# Patient Record
Sex: Female | Born: 1970 | Race: White | Hispanic: No | Marital: Married | Smoking: Never smoker
Health system: Southern US, Community
[De-identification: ages and names within clinical notes are randomized; demographics above are authoritative.]

## PROBLEM LIST (undated history)

## (undated) DIAGNOSIS — N6320 Unspecified lump in the left breast, unspecified quadrant: Secondary | ICD-10-CM

## (undated) DIAGNOSIS — R51 Headache: Secondary | ICD-10-CM

## (undated) DIAGNOSIS — R519 Headache, unspecified: Secondary | ICD-10-CM

## (undated) DIAGNOSIS — F32A Depression, unspecified: Secondary | ICD-10-CM

## (undated) DIAGNOSIS — F329 Major depressive disorder, single episode, unspecified: Secondary | ICD-10-CM

## (undated) HISTORY — PX: BREAST SURGERY: SHX581

---

## 2002-11-24 ENCOUNTER — Encounter: Payer: Self-pay | Admitting: Family Medicine

## 2002-11-24 ENCOUNTER — Encounter: Admission: RE | Admit: 2002-11-24 | Discharge: 2002-11-24 | Payer: Self-pay | Admitting: Family Medicine

## 2004-06-30 ENCOUNTER — Other Ambulatory Visit: Admission: RE | Admit: 2004-06-30 | Discharge: 2004-06-30 | Payer: Self-pay | Admitting: Obstetrics and Gynecology

## 2004-11-12 ENCOUNTER — Emergency Department (HOSPITAL_COMMUNITY): Admission: EM | Admit: 2004-11-12 | Discharge: 2004-11-12 | Payer: Self-pay | Admitting: Emergency Medicine

## 2004-11-12 ENCOUNTER — Encounter (INDEPENDENT_AMBULATORY_CARE_PROVIDER_SITE_OTHER): Payer: Self-pay | Admitting: Specialist

## 2005-01-23 ENCOUNTER — Other Ambulatory Visit: Admission: RE | Admit: 2005-01-23 | Discharge: 2005-01-23 | Payer: Self-pay | Admitting: Obstetrics and Gynecology

## 2005-08-26 ENCOUNTER — Encounter (INDEPENDENT_AMBULATORY_CARE_PROVIDER_SITE_OTHER): Payer: Self-pay | Admitting: Specialist

## 2005-08-26 ENCOUNTER — Inpatient Hospital Stay (HOSPITAL_COMMUNITY): Admission: AD | Admit: 2005-08-26 | Discharge: 2005-08-29 | Payer: Self-pay | Admitting: Obstetrics and Gynecology

## 2005-08-30 ENCOUNTER — Encounter: Admission: RE | Admit: 2005-08-30 | Discharge: 2005-09-29 | Payer: Self-pay | Admitting: Obstetrics and Gynecology

## 2005-09-28 ENCOUNTER — Other Ambulatory Visit: Admission: RE | Admit: 2005-09-28 | Discharge: 2005-09-28 | Payer: Self-pay | Admitting: Obstetrics and Gynecology

## 2007-04-06 ENCOUNTER — Emergency Department (HOSPITAL_COMMUNITY): Admission: EM | Admit: 2007-04-06 | Discharge: 2007-04-06 | Payer: Self-pay | Admitting: Family Medicine

## 2007-04-24 ENCOUNTER — Inpatient Hospital Stay (HOSPITAL_COMMUNITY): Admission: RE | Admit: 2007-04-24 | Discharge: 2007-04-26 | Payer: Self-pay | Admitting: Obstetrics and Gynecology

## 2008-10-01 ENCOUNTER — Other Ambulatory Visit: Admission: RE | Admit: 2008-10-01 | Discharge: 2008-10-01 | Payer: Self-pay | Admitting: Obstetrics and Gynecology

## 2008-10-08 ENCOUNTER — Encounter: Admission: RE | Admit: 2008-10-08 | Discharge: 2008-10-08 | Payer: Self-pay | Admitting: Obstetrics and Gynecology

## 2010-01-11 ENCOUNTER — Encounter: Admission: RE | Admit: 2010-01-11 | Discharge: 2010-01-11 | Payer: Self-pay | Admitting: Family Medicine

## 2010-03-10 ENCOUNTER — Ambulatory Visit (HOSPITAL_BASED_OUTPATIENT_CLINIC_OR_DEPARTMENT_OTHER): Admission: RE | Admit: 2010-03-10 | Discharge: 2010-03-10 | Payer: Self-pay | Admitting: Surgery

## 2010-03-15 ENCOUNTER — Other Ambulatory Visit: Admission: RE | Admit: 2010-03-15 | Discharge: 2010-03-15 | Payer: Self-pay | Admitting: Family Medicine

## 2010-03-24 ENCOUNTER — Ambulatory Visit: Payer: Self-pay | Admitting: Oncology

## 2010-04-12 LAB — CBC WITH DIFFERENTIAL (CANCER CENTER ONLY)
BASO%: 0.4 % (ref 0.0–2.0)
EOS%: 3.1 % (ref 0.0–7.0)
LYMPH#: 1.2 10*3/uL (ref 0.9–3.3)
LYMPH%: 21.3 % (ref 14.0–48.0)
MCHC: 34.9 g/dL (ref 32.0–36.0)
MCV: 89 fL (ref 81–101)
MONO#: 0.3 10*3/uL (ref 0.1–0.9)
Platelets: 265 10*3/uL (ref 145–400)
RDW: 12.2 % (ref 10.5–14.6)
WBC: 5.7 10*3/uL (ref 3.9–10.0)

## 2010-04-12 LAB — CMP (CANCER CENTER ONLY)
ALT(SGPT): 18 U/L (ref 10–47)
AST: 19 U/L (ref 11–38)
CO2: 27 mEq/L (ref 18–33)
Sodium: 132 mEq/L (ref 128–145)
Total Bilirubin: 0.6 mg/dl (ref 0.20–1.60)
Total Protein: 7.2 g/dL (ref 6.4–8.1)

## 2010-12-04 LAB — POCT HEMOGLOBIN-HEMACUE: Hemoglobin: 12.4 g/dL (ref 12.0–15.0)

## 2011-01-31 NOTE — H&P (Signed)
[name], [name] [name]            ACCOUNT NO.:  [id number]   MEDICAL RECORD NO.:  [id number]          PATIENT TYPE:  INP   LOCATION:                                FACILITY:  [facility]   PHYSICIAN:  Duke Salvia. Marcelle Overlie, M.D.DATE OF BIRTH:  05-21-1971   DATE OF ADMISSION:  04/06/2007  DATE OF DISCHARGE:  04/06/2007                              HISTORY & PHYSICAL   CHIEF COMPLAINT:  For repeat cesarean section at term.   HISTORY OF PRESENT ILLNESS:  A 40 year old G2, P1-0-1-1.  EDD  05/02/2007.  Presents for RCS at term.  She has an uncomplicated  pregnancy.  GBS negative.  One -hour GTT was normal.  First cesarean was  done for a large EFW at term.  Procedure including risk of bleeding,  infection, adjacent organ entry, _transfusion_ , wound infection,  phlebitis, all reviewed with her, which she understands and accepts.   ALLERGIES:  SULFA.   PAST SURGICAL HISTORY:  Cesarean section.   MEDICATIONS:  Prenatal vitamins.   REVIEW OF SYSTEMS:  Otherwise negative.   FAMILY HISTORY:  Please see her [facility] form.   SOCIAL HISTORY:  Please see her [facility] form.   PHYSICAL EXAMINATION:  VITAL SIGNS:  Temperature 98.2, blood pressure  120/78.  HEENT:  Unremarkable.  NECK:  Supple without masses.  LUNGS:  Clear.  HEART:  Regular rate and rhythm without murmurs, rubs or gallops.  BREASTS:  Without masses.  _term FH  fetal heart rate 140,  cervix was closed.  EXTREMITIES:  Unremarkable.  NEUROLOGIC:  Unremarkable.   IMPRESSION:  Term intrauterine pregnancy, previous cesarean section for  repeat.   PLAN:  Repeat cesarean section.  Procedure and risks reviewed as above.     Richard M. Marcelle Overlie, M.D.  Electronically Signed    RMH/MEDQ  D:  04/23/2007  T:  04/23/2007  Job:  [id number]

## 2011-01-31 NOTE — Op Note (Signed)
[name], [name]            ACCOUNT NO.:  [id number]   MEDICAL RECORD NO.:  [id number]          PATIENT TYPE:  INP   LOCATION:  9117                          FACILITY:  [facility]   PHYSICIAN:  Duke Salvia. Marcelle Overlie, M.D.DATE OF BIRTH:  [birth year]   DATE OF PROCEDURE:  04/24/2007  DATE OF DISCHARGE:                               OPERATIVE REPORT   PREOPERATIVE DIAGNOSIS:  Term intrauterine pregnancy, previous cesarean  section.   POSTOPERATIVE DIAGNOSIS:  Term intrauterine pregnancy, previous cesarean  section.   PROCEDURE:  Repeat low transverse cesarean section.   SURGEON:  Duke Salvia. Marcelle Overlie, M.D.   ANESTHESIA:  Spinal.   COMPLICATIONS:  None.   DRAINS:  Foley catheter.   BLOOD LOSS:  700 mL.   PROCEDURE AND FINDINGS:  The patient taken to the operating room, and  after an adequate level of spinal anesthetic was obtained with the  patient in left tilt position, the abdomen was prepped and draped in  usual manner for sterile abdominal procedures.  Foley catheter  positioned, draining clear urine.  Transverse incision made through the  old scar, which was excised and the ellipse carried down to the fascia,  which was incised transversely.  Rectus muscles divided in the midline.  Peritoneum entered superiorly without incident and extended vertically.  The vesicouterine serosa was dissected with sharp and blunt dissection.  The bladder blade was repositioned.  The lower segment was very thin.  A  small incision made in the lower segment and extended transversely with  blunt dissection.  Clear fluid noted.  The patient was then delivered of  a healthy female, Apgars 8 and 9.  The infant was suctioned, cord  clamped and infant passed to the pediatric team for further care.  Placenta was delivered manually intact.  The uterus exteriorized; cavity  wiped clean with laparotomy pack.  Closure was obtained in the first  layer with 0 chromic in a locked fashion, followed by an  imbricating  layer of 0 chromic.  This was hemostatic.  The bladder flap area was  intact and hemostatic.  Clear urine noted at that point.  Tubes and  ovaries were normal.  Prior to closure, sponge, needle and instrument  counts were reported as correct x2.  The perineum closed with running 3-  0 Vicryl suture.  The rectus muscles were reapproximated in the midline  with interrupted 3-0 Vicryl suture.  Then 0 PDS was used for the fascia  to close transversely from laterally to midline on either side.  Subcutaneous fat was undermined, made  hemostatic with the Bovie.  After irrigation was noted to be hemostatic,  then skin staples were used along with a pressure dressing.  She did  receive Ancef 1 gram IV after the cord was clamped, along with Pitocin  IV.  Again, clear urine noted at the end of the case.      Richard M. Marcelle Overlie, M.D.  Electronically Signed     RMH/MEDQ  D:  04/24/2007  T:  04/24/2007  Job:  [id number]

## 2011-01-31 NOTE — Discharge Summary (Signed)
[name], [name]            ACCOUNT NO.:  [id number]   MEDICAL RECORD NO.:  [id number]          PATIENT TYPE:  INP   LOCATION:  9117                          [facility]ACILITY:  [facility]   PHYSICIAN:  Dineen Kid. Rana Snare, M.D.    DATE OF BIRTH:  July 14, 1971   DATE OF ADMISSION:  04/24/2007  DATE OF DISCHARGE:  04/26/2007                               DISCHARGE SUMMARY   ADMITTING DIAGNOSES:  1. Intrauterine pregnancy at term.  2. Previous cesarean section, desires repeat.   DISCHARGE DIAGNOSES:  1. Status post low transverse cesarean section.  2. Viable female infant.   PROCEDURE:  Repeat low transverse cesarean section.   REASON FOR ADMISSION:  Please see dictated H&P.   HOSPITAL COURSE:  The patient is a 41 year old gravida 2, para 1-0-1-1  that presents to [facility] for a scheduled cesarean  section.  The patient had had a previous cesarean delivery and desired  repeat.  On the morning of admission the patient was taken to the  operating room where spinal anesthesia was administered without  difficulty.  A low transverse incision was made with the delivery of a  viable female infant weighing 8 pounds 4 ounces with Apgars of 8 at one  minute and 9 at five minutes.  The patient tolerated the procedure well  and was taken to the recovery room in stable condition.  On  postoperative day #1, the patient was without complaint.  Vital signs  were stable.  Abdomen was soft with good return of bowel function.  Fundus was firm and nontender.  Abdominal dressing was noted to be  clean, dry and intact.  Laboratory findings revealed hemoglobin of 9.1;  platelet count 174,000; wbc count of 6.9.  Blood type was known to be A  positive.  On postoperative day #2, the patient did desire early  discharge.  Vital signs were stable.  She was afebrile.  Abdomen was  soft.  Fundus firm and nontender.  Incision was clean, dry and intact.  Some ecchymosis was noted inferior to the incisional  site.  Staples were  left intact.  Discharge instructions were reviewed and the patient was  later discharged home.   CONDITION ON DISCHARGE:  Stable.   DIET:  Regular as tolerated.   ACTIVITY:  No heavy lifting, no driving x2 weeks, no vaginal entry.   FOLLOWUP:  The patient to follow up on Monday for staple removal.  She  is to call for temperature greater than 100 degrees, persistent nausea  and vomiting, heavy vaginal bleeding, and/or redness or drainage from  the incisional site.   DISCHARGE MEDICATIONS:  1. Percocet 5/325 #30 one p.o. every 4-6 hours p.r.n.  2. Motrin 600 mg every 6 hours.  3. Prenatal vitamins one p.o. daily.      Julio Sicks, N.P.      Dineen Kid Rana Snare, M.D.  Electronically Signed    CC/MEDQ  D:  04/26/2007  T:  04/26/2007  Job:  [id number]

## 2011-02-03 NOTE — H&P (Signed)
[name], [name]            ACCOUNT NO.:  [id number]   MEDICAL RECORD NO.:  [id number]          PATIENT TYPE:  INP   LOCATION:  NA                            [facility]  [facility]   PHYSICIAN:  Duke Salvia. Marcelle Overlie, M.D.DATE OF BIRTH:  [birth year]   DATE OF ADMISSION:  08/26/2005  DATE OF DISCHARGE:                                HISTORY & PHYSICAL   CHIEF COMPLAINT:  For primary cesarean section at term for macrosomia.   HISTORY OF PRESENT ILLNESS:  A 40 year old, G2, P0-0-1-0.  This patient has  had an early trisomy-21 screening at [facility] that was normal.  Her 18-week  ultrasound and AFP were normal.  One hour GTT was 138.  GBS was negative.  Was seen yesterday with a _scheduled [place] for EFW  .  Cervix was 1-cm dilated  but the vertex was floating way out of the pelvis.  EFW was 9 pounds 10  ounces.  We discussed primary cesarean section versus a trial of labor with  risk relative to CPD, shoulder dystocia, etcetera.  She considered both  options and prefers to proceed with primary cesarean section.  This  procedure including risk of bleeding, infection, transfusion, adjacent organ  injury all reviewed with her which she understands and accepts.   PAST MEDICAL HISTORY:  Blood type is A positive.   ALLERGIES:  SULFA.   Please see the [facility] for family history.   PHYSICAL EXAMINATION:  VITAL SIGNS:  Blood pressure 108/60, temp 98.8.  HEENT:  Unremarkable.  NECK:  Supple without masses.  LUNGS:  Clear.  CARDIOVASCULAR:  Regular rate and rhythm without murmurs, rubs, or gallops.  BREASTS:  Not examined.  ABDOMEN:  A 42-cm fundal height.  Fetal heart rate 140.  PELVIC:  Cervix was 1, floating vertex, membranes intact.  EXTREMITIES:  Unremarkable.  NEUROLOGIC:  Unremarkable.   IMPRESSION:  1.  A 42-week intrauterine pregnancy.  2.  Fetal macrosomia.   PLAN:  Primary cesarean section.  Procedure and risks reviewed as above.     Richard M. Marcelle Overlie, M.D.  Electronically  Signed    RMH/MEDQ  D:  08/25/2005  T:  08/25/2005  Job:  [id number]

## 2011-02-03 NOTE — Discharge Summary (Signed)
NAMEMACHAELA, [name]            ACCOUNT NO.:  [id number]   MEDICAL RECORD NO.:  [id number]          PATIENT TYPE:  INP   LOCATION:  9114                          [facility]ILITY[facility]   PHYSICIAN:  Zelphia Cairo, MD    DATE OF BIRTH:  Feb 14, 1971   DATE OF ADMISSION:  08/26/2005  DATE OF DISCHARGE:  08/29/2005                                 DISCHARGE SUMMARY   ADMITTING DIAGNOSES:  1.  Intrauterine pregnancy at 34 weeks estimated gestational age.  2.  Macrosomia.   DISCHARGE DIAGNOSES:  1.  Status post low transverse cesarean section.  2.  Viable female infant.   PROCEDURE:  Primary low transverse cesarean section.   REASON FOR ADMISSION:  Please see dictated H&P.   HOSPITAL COURSE:  The patient was a 40 year old gravida 2 para 0 that was  admitted to [facility] for a scheduled cesarean section.  The patient had had a pelvic ultrasound on the day prior to admission for  estimated fetal weight. Estimated fetal weight was 9 pounds 10 ounces.  Cervix was examined and noted to be 1 cm dilated but the vertex was floating  out of the pelvis. Decision was made to proceed with a primary low  transverse cesarean section. On the morning of admission the patient was  taken to the operating room where spinal anesthesia was administered without  difficulty. A low transverse incision was made with the delivery of a viable  female infant weighing 8 pounds 12 ounces with Apgars of 9 at one minute and 9  at five minutes. Arterial cord pH was 7.31. The patient tolerated the  procedure well and was taken to the recovery room in stable condition. On  postoperative day #1 the patient was without complaint. Vital signs were  stable. Abdomen was soft with good return of bowel function. Fundus firm and  nontender. Abdominal dressing was noted to be clean, dry and intact.  Laboratory findings revealed hemoglobin of 7.8. The patient was started on  iron supplementation with plan to recheck  CBC in the following morning. On  postoperative day #2 the patient was without complaint. She denied  lightheadedness or difficulty with ambulation. Vital signs were stable. She  was afebrile. Abdomen was soft, fundus firm and nontender. Abdominal  dressing had been removed revealing an incision that was clean, dry and  intact. Laboratory findings revealed hemoglobin stable at 7.4; platelet  count 174,000; wbc count of 8.0. On postoperative day #3 the patient was  without complaint. She was ambulating well, breast-feeding without  difficulty. Vital signs were stable. Fundus firm and nontender. Incision was  clean, dry and intact. Staples were removed and the patient was later  discharged home.   CONDITION ON DISCHARGE:  Stable.   DIET:  Regular as tolerated.   ACTIVITY:  No heavy lifting, no driving x2 weeks, no vaginal entry.   FOLLOW UP:  The patient is to follow up in the office in approximately 2  weeks for an incision check. She is to call for temperature greater than 100  degrees, persistent nausea and vomiting, heavy vaginal bleeding, and/or  redness  or drainage from the incisional site.   DISCHARGE MEDICATIONS:  1.  Percocet 5/325 #30 one p.o. every 4-6 hours p.r.n.  2.  Motrin 600 mg every 6 hours.  3.  Ferrous sulfate 325 mg one p.o. b.i.d.  4.  Prenatal vitamins one p.o. daily.  5.  Colace one p.o. daily p.r.n.      Julio Sicks, N.P.      Zelphia Cairo, MD  Electronically Signed    CC/MEDQ  D:  09/29/2005  T:  09/29/2005  Job:  [id number]

## 2011-02-03 NOTE — Op Note (Signed)
[name], [name]            ACCOUNT NO.:  [id number]   MEDICAL RECORD NO.:  [id number]          PATIENT TYPE:  INP   LOCATION:  9198                          FACILITY:  [facility]   PHYSICIAN:  Duke Salvia. Marcelle Overlie, M.D.DATE OF BIRTH:  1971-05-03   DATE OF PROCEDURE:  08/26/2005  DATE OF DISCHARGE:                                 OPERATIVE REPORT   PREOPERATIVE DIAGNOSIS:  A 41-week intrauterine pregnancy, macrosomia.   POSTOPERATIVE DIAGNOSIS:  A 41-week intrauterine pregnancy, macrosomia.   PROCEDURE:  Primary low transverse cesarean section.   SURGEON:  Dr. Marcelle Overlie.   ANESTHESIA:  Spinal.   COMPLICATIONS:  None.   DRAINS:  Foley catheter.   BLOOD LOSS:  700.   PROCEDURE FINDINGS:  The patient taken to the operating room after an  adequate level of spinal anesthetic was obtained with the patient's in  supine position. The abdomen prepped and draped in usual manner for sterile  abdominal procedure. Foley catheter positioned draining clear urine.  Transverse Pfannenstiel incision was made two fingerbreadths above the  symphysis, carried down the fascia which was incised and extended  transversely. Rectus muscle was divided midline. Peritoneum entered  superiorly without incident and extended in a vertical fashion. The  vesicouterine serosa was then incised, and the bladder was bluntly and  sharply dissected off of the lower uterine segment. A bladder blade was  repositioned. Transverse incision made in the lower segment, extended with  blunt dissection. Large amount of clear fluid was noted at that point. The  patient delivered an 8 pounds 12 ounces female, Apgars 9 and 9, from the  vertex presentation. The infant was suctioned, cord clamped and passed to  pediatric team for further care. Placenta was removed spontaneously and was  sent to pathology. Cavity was noted to be clean. The incision was then  closed with a running locked 0 chromic suture followed by an imbricating  layer of 0 chromic. Tubes and ovaries were normal. Prior to closure, sponge,  needle and instrument counts were correct x2. Peritoneum was then closed  with a running 3-0 Vicryl suture. Fascia closed from laterally to midline on  either side with a 0 PDS suture. Subcutaneous fat was hemostatic. Clips and  Steri-Strips used on the skin. She did receive Pitocin IV after the cord was  clamped along with Ancef 1 gram IV. Clear urine noted at the end of the  case. Clips and Steri-Strips applied and a sterile dressing.     Richard M. Marcelle Overlie, M.D.  Electronically Signed    RMH/MEDQ  D:  08/26/2005  T:  08/26/2005  Job:  [id number]

## 2011-07-03 LAB — CBC
HCT: 26.9 — ABNORMAL LOW
Hemoglobin: 11.5 — ABNORMAL LOW
MCHC: 33.8
MCV: 88.2
Platelets: 174
Platelets: 182
RDW: 14.5 — ABNORMAL HIGH
RDW: 14.7 — ABNORMAL HIGH

## 2011-07-03 LAB — RPR: RPR Ser Ql: NONREACTIVE

## 2011-07-03 LAB — TYPE AND SCREEN: ABO/RH(D): A POS

## 2011-08-03 ENCOUNTER — Other Ambulatory Visit (HOSPITAL_COMMUNITY)
Admission: RE | Admit: 2011-08-03 | Discharge: 2011-08-03 | Disposition: A | Discharge status: Home / Self Care | Payer: PRIVATE HEALTH INSURANCE | Source: Ambulatory Visit | Attending: Family Medicine | Admitting: Family Medicine

## 2011-08-03 ENCOUNTER — Other Ambulatory Visit: Payer: Self-pay | Admitting: Physician Assistant

## 2011-08-03 DIAGNOSIS — Z124 Encounter for screening for malignant neoplasm of cervix: Secondary | ICD-10-CM | POA: Insufficient documentation

## 2011-08-24 ENCOUNTER — Other Ambulatory Visit: Payer: Self-pay | Admitting: Family Medicine

## 2011-08-24 DIAGNOSIS — Z1231 Encounter for screening mammogram for malignant neoplasm of breast: Secondary | ICD-10-CM

## 2011-09-06 ENCOUNTER — Ambulatory Visit: Payer: Self-pay

## 2011-09-07 ENCOUNTER — Ambulatory Visit
Admission: RE | Admit: 2011-09-07 | Discharge: 2011-09-07 | Disposition: A | Discharge status: Home / Self Care | Payer: PRIVATE HEALTH INSURANCE | Source: Ambulatory Visit | Attending: Family Medicine | Admitting: Family Medicine

## 2011-09-07 DIAGNOSIS — Z1231 Encounter for screening mammogram for malignant neoplasm of breast: Secondary | ICD-10-CM

## 2012-08-06 ENCOUNTER — Other Ambulatory Visit (HOSPITAL_COMMUNITY)
Admission: RE | Admit: 2012-08-06 | Discharge: 2012-08-06 | Disposition: A | Discharge status: Home / Self Care | Payer: BC Managed Care – PPO | Source: Ambulatory Visit | Attending: Family Medicine | Admitting: Family Medicine

## 2012-08-06 ENCOUNTER — Other Ambulatory Visit: Payer: Self-pay | Admitting: Physician Assistant

## 2012-08-06 DIAGNOSIS — Z124 Encounter for screening for malignant neoplasm of cervix: Secondary | ICD-10-CM | POA: Insufficient documentation

## 2013-12-03 ENCOUNTER — Other Ambulatory Visit: Payer: Self-pay

## 2014-02-10 ENCOUNTER — Other Ambulatory Visit (HOSPITAL_COMMUNITY)
Admission: RE | Admit: 2014-02-10 | Discharge: 2014-02-10 | Disposition: A | Discharge status: Home / Self Care | Payer: BC Managed Care – PPO | Source: Ambulatory Visit | Attending: Family Medicine | Admitting: Family Medicine

## 2014-02-10 ENCOUNTER — Other Ambulatory Visit: Payer: Self-pay | Admitting: Physician Assistant

## 2014-02-10 DIAGNOSIS — Z124 Encounter for screening for malignant neoplasm of cervix: Secondary | ICD-10-CM | POA: Insufficient documentation

## 2015-11-29 ENCOUNTER — Other Ambulatory Visit: Payer: Self-pay | Admitting: Physician Assistant

## 2015-11-29 ENCOUNTER — Other Ambulatory Visit (HOSPITAL_COMMUNITY)
Admission: RE | Admit: 2015-11-29 | Discharge: 2015-11-29 | Disposition: A | Discharge status: Home / Self Care | Payer: BLUE CROSS/BLUE SHIELD | Source: Ambulatory Visit | Attending: Family Medicine | Admitting: Family Medicine

## 2015-11-29 DIAGNOSIS — Z124 Encounter for screening for malignant neoplasm of cervix: Secondary | ICD-10-CM | POA: Diagnosis present

## 2015-12-03 LAB — CYTOLOGY - PAP

## 2016-05-30 ENCOUNTER — Other Ambulatory Visit: Payer: Self-pay | Admitting: Physician Assistant

## 2016-06-02 ENCOUNTER — Other Ambulatory Visit: Payer: Self-pay | Admitting: Physician Assistant

## 2016-06-02 DIAGNOSIS — N6452 Nipple discharge: Secondary | ICD-10-CM

## 2016-06-07 ENCOUNTER — Ambulatory Visit
Admission: RE | Admit: 2016-06-07 | Discharge: 2016-06-07 | Disposition: A | Discharge status: Home / Self Care | Payer: 59 | Source: Ambulatory Visit | Attending: Physician Assistant | Admitting: Physician Assistant

## 2016-06-07 DIAGNOSIS — N6452 Nipple discharge: Secondary | ICD-10-CM

## 2016-06-09 ENCOUNTER — Other Ambulatory Visit: Payer: Self-pay | Admitting: Physician Assistant

## 2016-06-09 DIAGNOSIS — N6452 Nipple discharge: Secondary | ICD-10-CM

## 2016-06-17 ENCOUNTER — Ambulatory Visit
Admission: RE | Admit: 2016-06-17 | Discharge: 2016-06-17 | Disposition: A | Discharge status: Home / Self Care | Payer: 59 | Source: Ambulatory Visit | Attending: Physician Assistant | Admitting: Physician Assistant

## 2016-06-17 DIAGNOSIS — N6452 Nipple discharge: Secondary | ICD-10-CM

## 2016-06-17 MED ORDER — GADOBENATE DIMEGLUMINE 529 MG/ML IV SOLN
15.0000 mL | Freq: Once | INTRAVENOUS | Status: AC | PRN
  Administered 2016-06-17: 15 mL via INTRAVENOUS

## 2016-06-21 ENCOUNTER — Other Ambulatory Visit: Payer: Self-pay | Admitting: Physician Assistant

## 2016-06-21 DIAGNOSIS — R928 Other abnormal and inconclusive findings on diagnostic imaging of breast: Secondary | ICD-10-CM

## 2016-06-22 ENCOUNTER — Other Ambulatory Visit: Payer: Self-pay | Admitting: Physician Assistant

## 2016-06-22 DIAGNOSIS — N6452 Nipple discharge: Secondary | ICD-10-CM

## 2016-06-29 ENCOUNTER — Ambulatory Visit
Admission: RE | Admit: 2016-06-29 | Discharge: 2016-06-29 | Disposition: A | Discharge status: Home / Self Care | Payer: 59 | Source: Ambulatory Visit | Attending: Physician Assistant | Admitting: Physician Assistant

## 2016-06-29 DIAGNOSIS — N6452 Nipple discharge: Secondary | ICD-10-CM

## 2016-06-29 MED ORDER — GADOBENATE DIMEGLUMINE 529 MG/ML IV SOLN
15.0000 mL | Freq: Once | INTRAVENOUS | Status: AC | PRN
  Administered 2016-06-29: 15 mL via INTRAVENOUS

## 2016-07-03 ENCOUNTER — Ambulatory Visit: Payer: Self-pay | Admitting: Surgery

## 2016-07-03 DIAGNOSIS — N6320 Unspecified lump in the left breast, unspecified quadrant: Secondary | ICD-10-CM

## 2016-07-03 NOTE — H&P (Signed)
[name] 07/03/2016 10:47 AM Location: [facility] Patient #: [id number] DOB: 16-Apr-1971 Divorced / Language: Undefined / Race: White Female  History of Present Illness ([name] Chamblee MD; 07/03/2016 12:24 PM) Patient words: Patient sent at the request of the [facility] [place] for left breast mammographic abnormality a mass. She has a history of right breast nipple discharge 6 years ago treated with central duct excision for papillomatosis. She developed left breast brown to dark nipple discharge 12 months ago. The amount is been minimal blood persistent. She had workup of her left breast with mammography, MRI and physical exam. There are 2 areas of MRI abnormality in the left breast which were biopsied. These are in the left upper and left lower quadrants. The left outer quadrant biopsy was consistent with papilloma while the left lower quadrant biopsy was consistent with focal lobular hyperplasia which was atypical. She has 2 first-degree relatives with breast cancer. She has never undergone genetic screening. Left breast drainage has stopped since her biopsy.            CLINICAL DATA: Bloody nipple discharge on the left. Previous bloody nipple discharge on the right resulting in surgery demonstrating a papilloma.  LABS: None  EXAM: BILATERAL BREAST MRI WITH AND WITHOUT CONTRAST  TECHNIQUE: Multiplanar, multisequence MR images of both breasts were obtained prior to and following the intravenous administration of 15 ml of MultiHance.  THREE-DIMENSIONAL MR IMAGE RENDERING ON INDEPENDENT WORKSTATION:  Three-dimensional MR images were rendered by post-processing of the original MR data on an independent workstation. The three-dimensional MR images were interpreted, and findings are reported in the following complete MRI report for this study. Three dimensional images were evaluated at the independent DynaCad workstation  COMPARISON:  Previous exam(s).  FINDINGS: Breast composition: c. Heterogeneous fibroglandular tissue.  Background parenchymal enhancement: Moderate  Right breast: No mass or abnormal enhancement.  Left breast: There is a dilated milk duct seen in the retroareolar region at 3 o'clock. The material within the duct demonstrates low T2 and high T1 signal, likely proteinaceous debris or blood products. No enhancing intraductal mass identified. There is a mass in the superior lateral left breast on series 9, image 103 measuring 7.5 mm with persistent enhancement kinetics. There is clumped low-grade persistent enhancement in the upper outer left breast on series 9, image 80 spanning 2.8 cm. The remainder of the enhancement in the left breast is thought to be background in nature.  Lymph nodes: No abnormal appearing lymph nodes.  Ancillary findings: None.  IMPRESSION: 1. There is a dilated duct in the left periareolar region at 3 o'clock containing high T1 and low T2 material, likely proteinaceous debris or blood products. This correlates with the patient's history. There are however no enhancing intraductal masses identified within this or other ducts. 2. There is a mass in the upper outer left breast measuring 7.5 mm with persistent enhancement kinetics. It is possible this could represent a papilloma causing the patient's symptoms. 3. Clumped enhancement in the upper outer left breast spanning 2.8 cm.  RECOMMENDATION: Recommend MRI guided biopsy of the 7.5 mm mass in the upper outer left breast and the clumped enhancement spanning 2.8 cm in the upper outer left breast. If the biopsies do not result in a cause for bloody nipple discharge, recommend surgical consultation.  BI-RADS CATEGORY 4: Suspicious.   Electronically Signed By: [name] M.D On: 06/19/2016 12:46   Diagnosis 1. 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 2. Breast, left, needle core biopsy, UOQ - FOCAL ATYPICAL LOBULAR HYPERPLASIA. - FIBROCYSTIC CHANGES WITH USUAL DUCTAL HYPERPLASIA. - FOCAL PSEUDOANGIOMATOUS STROMAL HYPERPLASIA. - SEE COMMENT. Microscopic Comment 2. An E-Cadherin immunohistochemical stain is performed on the second specimen. The morphology coupled with the staining pattern is consistent with the above diagnosis. 1 and 2: The findings are called to the [facility] on 07/03/16. 1. Dr. Saralyn [name] has seen the first case in consultation with agreement that the findings are best characterized as a ductal papilloma. 2. Dr. Saralyn [name] has seen the second case in consultation with agreement that focal atypical lobular hyperplasia is present in block 2A. (RH:ds 07/03/16) Loretta Niece MD Pathologist, Electronic Signature (Case signed 07/03/2016) Specimen Gross and Clinical Information Specimen Comment 1. In formalin: 8:30, extracted less than 2 min; enhancing nodule; previous biosy: yes, contralateral breast 2. In formalin: 8:40, extracted less than 2 min; area of nonmass enhancement Specimen(s) Obtained: 1. Breast, left, needle core biopsy, LOQ 2. Breast, left, needle core biopsy, UOQ 1 of 2 FINAL for Pudwill, [name]  The patient is a 45 year old female.   Other Problems [name], CMA; 07/03/2016 10:47 AM) Depression  Past Surgical History [name], CMA; 07/03/2016 10:47 AM) Breast Biopsy Bilateral. Cesarean Section - Multiple  Diagnostic Studies History [name], CMA; 07/03/2016 10:47 AM) Colonoscopy never Mammogram within last year Pap Smear 1-5 years ago  Allergies [name], CMA; 07/03/2016 10:47 AM) No Known Drug Allergies 07/03/2016  Medication History [name], CMA; 07/03/2016 10:47 AM) DULoxetine HCl (60MG  Capsule DR Part, Oral) Active. Mirena (52 MG) (20MCG/24HR IUD, Intrauterine) Active. Medications Reconciled  Social  History [name], [place]; 07/03/2016 10:47 AM) Alcohol use Moderate alcohol use. Caffeine use Carbonated beverages, Coffee. Tobacco use Never smoker.  Family History [name], [place]; 07/03/2016 10:47 AM) Alcohol Abuse Father. Arthritis Mother. Breast Cancer Family Members In General. Depression Father. Heart Disease Family Members In General.  Pregnancy / Birth History [name], CMA; 07/03/2016 10:47 AM) Age at menarche 13 years. Contraceptive History Intrauterine device. Gravida 3 Irregular periods Length (months) of breastfeeding 7-12 Maternal age 59-35 Para 2     Review of Systems [name] CMA; 07/03/2016 10:47 AM) General Not Present- Appetite Loss, Chills, Fatigue, Fever, Night Sweats, Weight Gain and Weight Loss. Skin Not Present- Change in Wart/Mole, Dryness, Hives, Jaundice, New Lesions, Non-Healing Wounds, Rash and Ulcer. HEENT Present- Wears glasses/contact lenses. Not Present- Earache, Hearing Loss, Hoarseness, Nose Bleed, Oral Ulcers, Ringing in the Ears, Seasonal Allergies, Sinus Pain, Sore Throat, Visual Disturbances and Yellow Eyes. Respiratory Not Present- Bloody sputum, Chronic Cough, Difficulty Breathing, Snoring and Wheezing. Breast Not Present- Breast Mass, Breast Pain, Nipple Discharge and Skin Changes. Cardiovascular Not Present- Chest Pain, Difficulty Breathing Lying Down, Leg Cramps, Palpitations, Rapid Heart Rate, Shortness of Breath and Swelling of Extremities. Gastrointestinal Not Present- Abdominal Pain, Bloating, Bloody Stool, Change in Bowel Habits, Chronic diarrhea, Constipation, Difficulty Swallowing, Excessive gas, Gets full quickly at meals, Hemorrhoids, Indigestion, Nausea, Rectal Pain and Vomiting. Female Genitourinary Not Present- Frequency, Nocturia, Painful Urination, Pelvic Pain and Urgency. Musculoskeletal Not Present- Back Pain, Joint Pain, Joint Stiffness, Muscle Pain, Muscle Weakness and Swelling of  Extremities. Neurological Not Present- Decreased Memory, Fainting, Headaches, Numbness, Seizures, Tingling, Tremor, Trouble walking and Weakness. Psychiatric Present- Depression. Not Present- Anxiety, Bipolar, Change in Sleep Pattern, Fearful and Frequent crying. Endocrine Not Present- Cold Intolerance, Excessive Hunger, Hair Changes, Heat Intolerance, Hot flashes and New Diabetes. Hematology Not Present- Blood  Thinners, Easy Bruising, Excessive bleeding, Gland problems, HIV and Persistent Infections.  Vitals [name] CMA; 07/03/2016 10:48 AM) 07/03/2016 10:47 AM Weight: 180.4 lb Height: 64in Body Surface Area: 1.87 m Body Mass Index: 30.97 kg/m  Temp.: 98.60F(Temporal)  Pulse: 77 (Regular)  BP: 124/80 (Sitting, Left Arm, Standard)      Physical Exam (Mileidy Atkin A. Alleen Kehm MD; 07/03/2016 12:26 PM)  General Mental Status-Alert. General Appearance-Consistent with stated age. Hydration-Well hydrated. Voice-Normal.  Head and Neck Head-normocephalic, atraumatic with no lesions or palpable masses. Trachea-midline. Thyroid Gland Characteristics - normal size and consistency.  Eye Eyeball - Bilateral-Extraocular movements intact. Sclera/Conjunctiva - Bilateral-No scleral icterus.  Chest and Lung Exam Chest and lung exam reveals -quiet, even and easy respiratory effort with no use of accessory muscles and on auscultation, normal breath sounds, no adventitious sounds and normal vocal resonance. Inspection Chest Wall - Normal. Back - normal.  Breast Note: Right breast scar noted. No right nipple discharge. No right breast masses. Left pressure is no nipple discharge. No masses left breast.  Cardiovascular Cardiovascular examination reveals -normal heart sounds, regular rate and rhythm with no murmurs and normal pedal pulses bilaterally.  Neurologic Neurologic evaluation reveals -alert and oriented x 3 with no impairment of recent or remote  memory. Mental Status-Normal.  Musculoskeletal Normal Exam - Left-Upper Extremity Strength Normal and Lower Extremity Strength Normal. Normal Exam - Right-Upper Extremity Strength Normal and Lower Extremity Strength Normal.  Lymphatic Head & Neck  General Head & Neck Lymphatics: Bilateral - Description - Normal. Axillary  General Axillary Region: Bilateral - Description - Normal. Tenderness - Non Tender.    Assessment & Plan (Ketina Mars A. Dannisha Eckmann MD; 07/03/2016 12:27 PM)  ATYPICAL DUCTAL HYPERPLASIA OF LEFT BREAST (N60.92) Impression: Recommend lumpectomy given small risk of malignancy.  May need to see high risk clinic given her atypical lobular hyperplasia finding.  May benefit from genetic screening. Risk of lumpectomy include bleeding, infection, seroma, more surgery, use of seed/wire, wound care, cosmetic deformity and the need for other treatments, death , blood clots, death. Pt agrees to proceed.  Current Plans We discussed the staging and pathophysiology of breast cancer. We discussed all of the different options for treatment for breast cancer including surgery, chemotherapy, radiation therapy, Herceptin, and antiestrogen therapy. We discussed a sentinel lymph node biopsy as she does not appear to having lymph node involvement right now. We discussed the performance of that with injection of radioactive tracer and blue dye. We discussed that she would have an incision underneath her axillary hairline. We discussed that there is a bout a 10-20% chance of having a positive node with a sentinel lymph node biopsy and we will await the permanent pathology to make any other first further decisions in terms of her treatment. One of these options might be to return to the operating room to perform an axillary lymph node dissection. We discussed about a 1-2% risk lifetime of chronic shoulder pain as well as lymphedema associated with a sentinel lymph node biopsy. We discussed the  options for treatment of the breast cancer which included lumpectomy versus a mastectomy. We discussed the performance of the lumpectomy with a wire placement. We discussed a 10-20% chance of a positive margin requiring reexcision in the operating room. We also discussed that she may need radiation therapy or antiestrogen therapy or both if she undergoes lumpectomy. We discussed the mastectomy and the postoperative care for that as well. We discussed that there is no difference in her survival whether she undergoes  lumpectomy with radiation therapy or antiestrogen therapy versus a mastectomy. There is a slight difference in the local recurrence rate being 3-5% with lumpectomy and about 1% with a mastectomy. We discussed the risks of operation including bleeding, infection, possible reoperation. She understands her further therapy will be based on what her stages at the time of her operation.  Pt Education - [facility] breast cancer surgery: discussed with patient and provided information. 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

## 2016-07-21 ENCOUNTER — Other Ambulatory Visit: Payer: Self-pay | Admitting: Surgery

## 2016-07-21 DIAGNOSIS — N6320 Unspecified lump in the left breast, unspecified quadrant: Secondary | ICD-10-CM

## 2016-08-16 ENCOUNTER — Encounter (HOSPITAL_BASED_OUTPATIENT_CLINIC_OR_DEPARTMENT_OTHER): Payer: Self-pay | Admitting: *Deleted

## 2016-08-21 NOTE — Progress Notes (Signed)
Pt instructed to drink Boost by 0800 day of surgery with teach back method.

## 2016-08-22 ENCOUNTER — Ambulatory Visit (HOSPITAL_BASED_OUTPATIENT_CLINIC_OR_DEPARTMENT_OTHER): Payer: 59 | Admitting: Anesthesiology

## 2016-08-22 ENCOUNTER — Ambulatory Visit
Admission: RE | Admit: 2016-08-22 | Discharge: 2016-08-22 | Disposition: A | Discharge status: Home / Self Care | Payer: 59 | Source: Ambulatory Visit | Attending: Surgery | Admitting: Surgery

## 2016-08-22 ENCOUNTER — Encounter (HOSPITAL_BASED_OUTPATIENT_CLINIC_OR_DEPARTMENT_OTHER): Payer: Self-pay | Admitting: *Deleted

## 2016-08-22 ENCOUNTER — Encounter (HOSPITAL_BASED_OUTPATIENT_CLINIC_OR_DEPARTMENT_OTHER)
Admission: RE | Disposition: A | Discharge status: Home / Self Care | Payer: Self-pay | Source: Ambulatory Visit | Attending: Surgery

## 2016-08-22 ENCOUNTER — Ambulatory Visit (HOSPITAL_BASED_OUTPATIENT_CLINIC_OR_DEPARTMENT_OTHER)
Admission: RE | Admit: 2016-08-22 | Discharge: 2016-08-22 | Disposition: A | Discharge status: Home / Self Care | Payer: 59 | Source: Ambulatory Visit | Attending: Surgery | Admitting: Surgery

## 2016-08-22 DIAGNOSIS — D242 Benign neoplasm of left breast: Secondary | ICD-10-CM | POA: Insufficient documentation

## 2016-08-22 DIAGNOSIS — N6320 Unspecified lump in the left breast, unspecified quadrant: Secondary | ICD-10-CM

## 2016-08-22 DIAGNOSIS — N6092 Unspecified benign mammary dysplasia of left breast: Secondary | ICD-10-CM | POA: Diagnosis not present

## 2016-08-22 DIAGNOSIS — Z803 Family history of malignant neoplasm of breast: Secondary | ICD-10-CM | POA: Diagnosis not present

## 2016-08-22 HISTORY — DX: Headache: R51

## 2016-08-22 HISTORY — DX: Major depressive disorder, single episode, unspecified: F32.9

## 2016-08-22 HISTORY — DX: Depression, unspecified: F32.A

## 2016-08-22 HISTORY — DX: Headache, unspecified: R51.9

## 2016-08-22 HISTORY — DX: Unspecified lump in the left breast, unspecified quadrant: N63.20

## 2016-08-22 HISTORY — PX: BREAST LUMPECTOMY WITH NEEDLE LOCALIZATION: SHX5759

## 2016-08-22 SURGERY — BREAST LUMPECTOMY WITH NEEDLE LOCALIZATION
Anesthesia: General | Site: Breast | Laterality: Left

## 2016-08-22 MED ORDER — LIDOCAINE 2% (20 MG/ML) 5 ML SYRINGE
INTRAMUSCULAR | Status: AC
  Filled 2016-08-22: qty 5

## 2016-08-22 MED ORDER — MIDAZOLAM HCL 2 MG/2ML IJ SOLN
INTRAMUSCULAR | Status: AC
  Filled 2016-08-22: qty 2

## 2016-08-22 MED ORDER — ONDANSETRON HCL 4 MG/2ML IJ SOLN
INTRAMUSCULAR | Status: AC
  Filled 2016-08-22: qty 2

## 2016-08-22 MED ORDER — GLYCOPYRROLATE 0.2 MG/ML IJ SOLN
INTRAMUSCULAR | Status: DC | PRN
  Administered 2016-08-22: 0.2 mg via INTRAVENOUS

## 2016-08-22 MED ORDER — MIDAZOLAM HCL 2 MG/2ML IJ SOLN
1.0000 mg | INTRAMUSCULAR | Status: DC | PRN
Start: 2016-08-22 — End: 2016-08-22

## 2016-08-22 MED ORDER — DEXTROSE 5 % IV SOLN: 3.0000 g | INTRAVENOUS | Status: DC

## 2016-08-22 MED ORDER — LACTATED RINGERS IV SOLN
INTRAVENOUS | Status: DC
  Administered 2016-08-22: 11:00:00 via INTRAVENOUS

## 2016-08-22 MED ORDER — DEXAMETHASONE SODIUM PHOSPHATE 10 MG/ML IJ SOLN
INTRAMUSCULAR | Status: AC
  Filled 2016-08-22: qty 1

## 2016-08-22 MED ORDER — PROPOFOL 10 MG/ML IV BOLUS
INTRAVENOUS | Status: AC
  Filled 2016-08-22: qty 20

## 2016-08-22 MED ORDER — FENTANYL CITRATE (PF) 100 MCG/2ML IJ SOLN
INTRAMUSCULAR | Status: AC
  Filled 2016-08-22: qty 2

## 2016-08-22 MED ORDER — DEXAMETHASONE SODIUM PHOSPHATE 4 MG/ML IJ SOLN
INTRAMUSCULAR | Status: DC | PRN
  Administered 2016-08-22: 10 mg via INTRAVENOUS

## 2016-08-22 MED ORDER — HYDROCODONE-ACETAMINOPHEN 5-325 MG PO TABS: 1.0000 | ORAL_TABLET | Freq: Four times a day (QID) | ORAL | 0 refills | Status: AC | PRN

## 2016-08-22 MED ORDER — PROPOFOL 10 MG/ML IV BOLUS
INTRAVENOUS | Status: DC | PRN
  Administered 2016-08-22: 200 mg via INTRAVENOUS

## 2016-08-22 MED ORDER — CHLORHEXIDINE GLUCONATE CLOTH 2 % EX PADS: 6.0000 | MEDICATED_PAD | Freq: Once | CUTANEOUS | Status: DC

## 2016-08-22 MED ORDER — HYDROMORPHONE HCL 1 MG/ML IJ SOLN
INTRAMUSCULAR | Status: AC
  Filled 2016-08-22: qty 1

## 2016-08-22 MED ORDER — LIDOCAINE HCL (CARDIAC) 20 MG/ML IV SOLN
INTRAVENOUS | Status: DC | PRN
  Administered 2016-08-22: 50 mg via INTRAVENOUS

## 2016-08-22 MED ORDER — MEPERIDINE HCL 25 MG/ML IJ SOLN: 6.2500 mg | INTRAMUSCULAR | Status: DC | PRN

## 2016-08-22 MED ORDER — CEFAZOLIN SODIUM-DEXTROSE 2-3 GM-% IV SOLR
INTRAVENOUS | Status: DC | PRN
  Administered 2016-08-22: 1 g via INTRAVENOUS
  Administered 2016-08-22: 2 g via INTRAVENOUS

## 2016-08-22 MED ORDER — MIDAZOLAM HCL 2 MG/2ML IJ SOLN
INTRAMUSCULAR | Status: DC | PRN
  Administered 2016-08-22: 2 mg via INTRAVENOUS

## 2016-08-22 MED ORDER — SCOPOLAMINE 1 MG/3DAYS TD PT72: 1.0000 | MEDICATED_PATCH | Freq: Once | TRANSDERMAL | Status: DC | PRN

## 2016-08-22 MED ORDER — ONDANSETRON HCL 4 MG/2ML IJ SOLN
INTRAMUSCULAR | Status: DC | PRN
  Administered 2016-08-22: 4 mg via INTRAVENOUS

## 2016-08-22 MED ORDER — ONDANSETRON HCL 4 MG/2ML IJ SOLN: 4.0000 mg | Freq: Once | INTRAMUSCULAR | Status: DC | PRN

## 2016-08-22 MED ORDER — BUPIVACAINE-EPINEPHRINE 0.25% -1:200000 IJ SOLN
INTRAMUSCULAR | Status: DC | PRN
  Administered 2016-08-22: 20 mL

## 2016-08-22 MED ORDER — FENTANYL CITRATE (PF) 100 MCG/2ML IJ SOLN
INTRAMUSCULAR | Status: DC | PRN
  Administered 2016-08-22: 100 ug via INTRAVENOUS

## 2016-08-22 MED ORDER — HYDROMORPHONE HCL 1 MG/ML IJ SOLN
0.2500 mg | INTRAMUSCULAR | Status: DC | PRN
  Administered 2016-08-22 (×2): 0.5 mg via INTRAVENOUS

## 2016-08-22 MED ORDER — LACTATED RINGERS IV SOLN
INTRAVENOUS | Status: DC | PRN
  Administered 2016-08-22: 12:00:00 via INTRAVENOUS

## 2016-08-22 MED ORDER — CEFAZOLIN SODIUM-DEXTROSE 2-4 GM/100ML-% IV SOLN
INTRAVENOUS | Status: AC
  Filled 2016-08-22: qty 100

## 2016-08-22 MED ORDER — FENTANYL CITRATE (PF) 100 MCG/2ML IJ SOLN: 50.0000 ug | INTRAMUSCULAR | Status: DC | PRN

## 2016-08-22 SURGICAL SUPPLY — 53 items
ADH SKN CLS APL DERMABOND .7 (GAUZE/BANDAGES/DRESSINGS) ×1
APPLIER CLIP 11 MED OPEN (CLIP)
APPLIER CLIP 9.375 MED OPEN (MISCELLANEOUS)
APR CLP MED 11 20 MLT OPN (CLIP)
APR CLP MED 9.3 20 MLT OPN (MISCELLANEOUS)
BINDER BREAST LRG (GAUZE/BANDAGES/DRESSINGS) IMPLANT
BINDER BREAST MEDIUM (GAUZE/BANDAGES/DRESSINGS) IMPLANT
BINDER BREAST XLRG (GAUZE/BANDAGES/DRESSINGS) IMPLANT
BINDER BREAST XXLRG (GAUZE/BANDAGES/DRESSINGS) ×1 IMPLANT
BIOPATCH RED 1 DISK 7.0 (GAUZE/BANDAGES/DRESSINGS) IMPLANT
BLADE SURG 15 STRL LF DISP TIS (BLADE) ×1 IMPLANT
BLADE SURG 15 STRL SS (BLADE) ×2
CANISTER SUCT 1200ML W/VALVE (MISCELLANEOUS) ×2 IMPLANT
CHLORAPREP W/TINT 26ML (MISCELLANEOUS) ×2 IMPLANT
CLIP APPLIE 11 MED OPEN (CLIP) IMPLANT
CLIP APPLIE 9.375 MED OPEN (MISCELLANEOUS) IMPLANT
COVER BACK TABLE 60X90IN (DRAPES) ×2 IMPLANT
COVER MAYO STAND STRL (DRAPES) ×2 IMPLANT
DECANTER SPIKE VIAL GLASS SM (MISCELLANEOUS) ×1 IMPLANT
DERMABOND ADVANCED (GAUZE/BANDAGES/DRESSINGS) ×1
DERMABOND ADVANCED .7 DNX12 (GAUZE/BANDAGES/DRESSINGS) ×1 IMPLANT
DEVICE DUBIN W/COMP PLATE 8390 (MISCELLANEOUS) ×2 IMPLANT
DRAIN CHANNEL 19F RND (DRAIN) IMPLANT
DRAPE LAPAROSCOPIC ABDOMINAL (DRAPES) IMPLANT
DRAPE LAPAROTOMY 100X72 PEDS (DRAPES) ×2 IMPLANT
DRAPE UTILITY XL STRL (DRAPES) ×2 IMPLANT
ELECT COATED BLADE 2.86 ST (ELECTRODE) ×2 IMPLANT
ELECT REM PT RETURN 9FT ADLT (ELECTROSURGICAL) ×2
ELECTRODE REM PT RTRN 9FT ADLT (ELECTROSURGICAL) ×1 IMPLANT
EVACUATOR SILICONE 100CC (DRAIN) IMPLANT
GLOVE BIOGEL PI IND STRL 8 (GLOVE) ×1 IMPLANT
GLOVE BIOGEL PI INDICATOR 8 (GLOVE) ×1
GLOVE ECLIPSE 8.0 STRL XLNG CF (GLOVE) ×2 IMPLANT
GLOVE EXAM NITRILE EXT CUFF MD (GLOVE) ×1 IMPLANT
GLOVE SURG SS PI 7.0 STRL IVOR (GLOVE) ×1 IMPLANT
GOWN STRL REUS W/ TWL LRG LVL3 (GOWN DISPOSABLE) ×2 IMPLANT
GOWN STRL REUS W/TWL LRG LVL3 (GOWN DISPOSABLE) ×4
KIT MARKER MARGIN INK (KITS) ×1 IMPLANT
NDL HYPO 25X1 1.5 SAFETY (NEEDLE) ×1 IMPLANT
NEEDLE HYPO 25X1 1.5 SAFETY (NEEDLE) ×2 IMPLANT
NS IRRIG 1000ML POUR BTL (IV SOLUTION) ×2 IMPLANT
PACK BASIN DAY SURGERY FS (CUSTOM PROCEDURE TRAY) ×2 IMPLANT
PENCIL BUTTON HOLSTER BLD 10FT (ELECTRODE) ×2 IMPLANT
SLEEVE SCD COMPRESS KNEE MED (MISCELLANEOUS) ×2 IMPLANT
SPONGE LAP 4X18 X RAY DECT (DISPOSABLE) ×1 IMPLANT
SUT MON AB 4-0 PC3 18 (SUTURE) ×2 IMPLANT
SUT SILK 2 0 SH (SUTURE) IMPLANT
SUT VICRYL 3-0 CR8 SH (SUTURE) ×3 IMPLANT
SYR CONTROL 10ML LL (SYRINGE) ×2 IMPLANT
TOWEL OR 17X24 6PK STRL BLUE (TOWEL DISPOSABLE) ×3 IMPLANT
TOWEL OR NON WOVEN STRL DISP B (DISPOSABLE) ×1 IMPLANT
TUBE CONNECTING 20X1/4 (TUBING) ×2 IMPLANT
YANKAUER SUCT BULB TIP NO VENT (SUCTIONS) ×2 IMPLANT

## 2016-08-22 NOTE — Anesthesia Procedure Notes (Signed)
Procedure Name: LMA Insertion Date/Time: 08/22/2016 12:09 PM Performed by: Rayvon Char Pre-anesthesia Checklist: Patient identified, Emergency Drugs available, Suction available and Patient being monitored Patient Re-evaluated:Patient Re-evaluated prior to inductionOxygen Delivery Method: Circle system utilized Preoxygenation: Pre-oxygenation with 100% oxygen Intubation Type: IV induction Ventilation: Mask ventilation without difficulty LMA: LMA inserted LMA Size: 3.0 Dental Injury: Teeth and Oropharynx as per pre-operative assessment

## 2016-08-22 NOTE — Anesthesia Postprocedure Evaluation (Signed)
Anesthesia Post Note  Patient: Garima Stodghill [place]  Procedure(s) Performed: Procedure(s) (LRB): LEFT BREAST LUMPECTOMY WITH NEEDLE LOCALIZATION X2 (Left)  Patient location during evaluation: PACU Anesthesia Type: General Level of consciousness: awake and alert Pain management: pain level controlled Vital Signs Assessment: post-procedure vital signs reviewed and stable Respiratory status: spontaneous breathing, nonlabored ventilation, respiratory function stable and patient connected to nasal cannula oxygen Cardiovascular status: blood pressure returned to baseline and stable Postop Assessment: no signs of nausea or vomiting Anesthetic complications: no    Last Vitals:  Vitals:   08/22/16 1345 08/22/16 1400  BP: 140/79 (!) 150/92  Pulse: 84 89  Resp: 11 16  Temp:  37.1 C    Last Pain:  Vitals:   08/22/16 1400  TempSrc:   PainSc: 1                  Kambra Beachem DAVID

## 2016-08-22 NOTE — Interval H&P Note (Signed)
History and Physical Interval Note:  08/22/2016 11:46 AM  [name]  has presented today for surgery, with the diagnosis of LEFT BREAST MASS X2  The various methods of treatment have been discussed with the patient and family. After consideration of risks, benefits and other options for treatment, the patient has consented to  Procedure(s): LEFT BREAST LUMPECTOMY WITH NEEDLE LOCALIZATION X2 (Left) as a surgical intervention .  The patient's history has been reviewed, patient examined, no change in status, stable for surgery.  I have reviewed the patient's chart and labs.  Questions were answered to the patient's satisfaction.     Hrishikesh Hoeg A.

## 2016-08-22 NOTE — H&P (Signed)
Pre-op/Pre-procedure Orders Open   [facility]  Loretta Luna, MD  General Surgery   Left breast mass  Dx   Additional Documentation   Encounter Info:   Billing Info,   History,   Allergies,   Detailed Report     All Notes   H&P by [name] 12:24 PM   Author: Erroll Luna, MD Author Type: Physician Filed: 07/03/2016 12:27 PM  Note Status: Signed Cosign: Cosign Not Required Encounter Date:  12:24 PM  Editor: Loretta Luna, MD (Physician)    [name] 07/03/2016 10:47 AM Location: [facility] Patient #: [id number] DOB: 06-Jul-1971 Divorced / Language: Undefined / Race: White Female  History of Present Illness ([name] A. [name] MD; Patient words: Patient sent at the request of the [facility]anda for left breast mammographic abnormality a mass. She has a history of right breast nipple discharge 6 years ago treated with central duct excision for papillomatosis. She developed left breast brown to dark nipple discharge 12 months ago. The amount is been minimal blood persistent. She had workup of her left breast with mammography, MRI and physical exam. There are 2 areas of MRI abnormality in the left breast which were biopsied. These are in the left upper and left lower quadrants. The left outer quadrant biopsy was consistent with papilloma while the left lower quadrant biopsy was consistent with focal lobular hyperplasia which was atypical. She has 2 first-degree relatives with breast cancer. She has never undergone genetic screening. Left breast drainage has stopped since her biopsy.            CLINICAL DATA: Bloody nipple discharge on the left. Previous bloody nipple discharge on the right resulting in surgery demonstrating a papilloma.  LABS: None  EXAM: BILATERAL BREAST MRI WITH AND WITHOUT CONTRAST  TECHNIQUE: Multiplanar, multisequence MR images of both breasts were obtained prior to and  following the intravenous administration of 15 ml of MultiHance.  THREE-DIMENSIONAL MR IMAGE RENDERING ON INDEPENDENT WORKSTATION:  Three-dimensional MR images were rendered by post-processing of the original MR data on an independent workstation. The three-dimensional MR images were interpreted, and findings are reported in the following complete MRI report for this study. Three dimensional images were evaluated at the independent DynaCad workstation  COMPARISON: Previous exam(s).  FINDINGS: Breast composition: c. Heterogeneous fibroglandular tissue.  Background parenchymal enhancement: Moderate  Right breast: No mass or abnormal enhancement.  Left breast: There is a dilated milk duct seen in the retroareolar region at 3 o'clock. The material within the duct demonstrates low T2 and high T1 signal, likely proteinaceous debris or blood products. No enhancing intraductal mass identified. There is a mass in the superior lateral left breast on series 9, image 103 measuring 7.5 mm with persistent enhancement kinetics. There is clumped low-grade persistent enhancement in the upper outer left breast on series 9, image 80 spanning 2.8 cm. The remainder of the enhancement in the left breast is thought to be background in nature.  Lymph nodes: No abnormal appearing lymph nodes.  Ancillary findings: None.  IMPRESSION: 1. There is a dilated duct in the left periareolar region at 3 o'clock containing high T1 and low T2 material, likely proteinaceous debris or blood products. This correlates with the patient's history. There are however no enhancing intraductal masses identified within this or other ducts. 2. There is a mass in the upper outer left breast measuring 7.5 mm with persistent enhancement kinetics. It is possible this could represent a papilloma causing the patient's symptoms. 3. Clumped  enhancement in the upper outer left breast spanning  2.8 cm.  RECOMMENDATION: Recommend MRI guided biopsy of the 7.5 mm mass in the upper outer left breast and the clumped enhancement spanning 2.8 cm in the upper outer left breast. If the biopsies do not result in a cause for bloody nipple discharge, recommend surgical consultation.  BI-RADS CATEGORY 4: Suspicious.   Electronically Signed By: [name] [name] On: 06/19/2016 12:46   Diagnosis 1. 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 2. Breast, left, needle core biopsy, UOQ - FOCAL ATYPICAL LOBULAR HYPERPLASIA. - FIBROCYSTIC CHANGES WITH USUAL DUCTAL HYPERPLASIA. - FOCAL PSEUDOANGIOMATOUS STROMAL HYPERPLASIA. - SEE COMMENT. Microscopic Comment 2. An E-Cadherin immunohistochemical stain is performed on the second specimen. The morphology coupled with the staining pattern is consistent with the above diagnosis. 1 and 2: The findings are called to the [name] on 07/03/16. 1. [name] has seen the first case in consultation with agreement that the findings are best characterized as a ductal papilloma. 2. [name] has seen the second case in consultation with agreement that focal atypical lobular hyperplasia is present in block 2A. (RH:ds 07/03/16) Loretta Niece MD Pathologist, Electronic Signature (Case signed 07/03/2016) Specimen Gross and Clinical Information Specimen Comment 1. In formalin: 8:30, extracted less than 2 min; enhancing nodule; previous biosy: yes, contralateral breast 2. In formalin: 8:40, extracted less than 2 min; area of nonmass enhancement Specimen(s) Obtained: 1. Breast, left, needle core biopsy, LOQ 2. Breast, left, needle core biopsy, UOQ 1 of 2 FINAL for [name], [name]  The patient is a 45 year old female.   Other Problems [name], CMA; 1 10:47 AM) Depression  Past Surgical History [name], CMA; 10/10:47  AM) Breast Biopsy Bilateral. Cesarean Section - Multiple  Diagnostic Studies History [name], CMA; 107 10:47 AM) Colonoscopy never Mammogram within last year Pap Smear 1-5 years ago  Allergies [name], CMA; 0:47 AM) No Known Drug Allergies   Medication History [name], CMA; 0:47 AM) DULoxetine HCl (60MG  Capsule DR Part, Oral) Active. Mirena (52 MG) (20MCG/24HR IUD, Intrauterine) Active. Medications Reconciled  Social History [name], CMA 10:47 AM) Alcohol use Moderate alcohol use. Caffeine use Carbonated beverages, Coffee. Tobacco use Never smoker.  Family History [name], CMA10:47 AM) Alcohol Abuse Father. Arthritis Mother. Breast Cancer Family Members In General. Depression Father. Heart Disease Family Members In General.  Pregnancy / Birth History [name], CMA:47 AM) Age at menarche 61 years. Contraceptive History Intrauterine device. Gravida 3 Irregular periods Length (months) of breastfeeding 7-12 Maternal age 40-35 Para 2     Review of Systems [name] CMA; 10:47 AM) General Not Present- Appetite Loss, Chills, Fatigue, Fever, Night Sweats, Weight Gain and Weight Loss. Skin Not Present- Change in Wart/Mole, Dryness, Hives, Jaundice, New Lesions, Non-Healing Wounds, Rash and Ulcer. HEENT Present- Wears glasses/contact lenses. Not Present- Earache, Hearing Loss, Hoarseness, Nose Bleed, Oral Ulcers, Ringing in the Ears, Seasonal Allergies, Sinus Pain, Sore Throat, Visual Disturbances and Yellow Eyes. Respiratory Not Present- Bloody sputum, Chronic Cough, Difficulty Breathing, Snoring and Wheezing. Breast Not Present- Breast Mass, Breast Pain, Nipple Discharge and Skin Changes. Cardiovascular Not Present- Chest Pain, Difficulty Breathing Lying Down, Leg Cramps, Palpitations, Rapid Heart Rate, Shortness of Breath and Swelling of Extremities. Gastrointestinal Not Present- Abdominal Pain, Bloating,  Bloody Stool, Change in Bowel Habits, Chronic diarrhea, Constipation, Difficulty Swallowing, Excessive gas, Gets full quickly at meals, Hemorrhoids, Indigestion, Nausea, Rectal Pain  and Vomiting. Female Genitourinary Not Present- Frequency, Nocturia, Painful Urination, Pelvic Pain and Urgency. Musculoskeletal Not Present- Back Pain, Joint Pain, Joint Stiffness, Muscle Pain, Muscle Weakness and Swelling of Extremities. Neurological Not Present- Decreased Memory, Fainting, Headaches, Numbness, Seizures, Tingling, Tremor, Trouble walking and Weakness. Psychiatric Present- Depression. Not Present- Anxiety, Bipolar, Change in Sleep Pattern, Fearful and Frequent crying. Endocrine Not Present- Cold Intolerance, Excessive Hunger, Hair Changes, Heat Intolerance, Hot flashes and New Diabetes. Hematology Not Present- Blood Thinners, Easy Bruising, Excessive bleeding, Gland problems, HIV and Persistent Infections.  Vitals [name] CMA;  10:48 AM) 07/03/2016 10:47 AM Weight: 180.4 lb Height: 64in Body Surface Area: 1.87 m Body Mass Index: 30.97 kg/m  Temp.: 98.26F(Temporal)  Pulse: 77 (Regular)  BP: 124/80 (Sitting, Left Arm, Standard)      Physical Exam (Jacey Pelc A. Filimon Miranda MD7 12:26 PM)  General Mental Status-Alert. General Appearance-Consistent with stated age. Hydration-Well hydrated. Voice-Normal.  Head and Neck Head-normocephalic, atraumatic with no lesions or palpable masses. Trachea-midline. Thyroid Gland Characteristics - normal size and consistency.  Eye Eyeball - Bilateral-Extraocular movements intact. Sclera/Conjunctiva - Bilateral-No scleral icterus.  Chest and Lung Exam Chest and lung exam reveals -quiet, even and easy respiratory effort with no use of accessory muscles and on auscultation, normal breath sounds, no adventitious sounds and normal vocal resonance. Inspection Chest Wall - Normal. Back - normal.  Breast Note:  Right breast scar noted. No right nipple discharge. No right breast masses. Left pressure is no nipple discharge. No masses left breast.  Cardiovascular Cardiovascular examination reveals -normal heart sounds, regular rate and rhythm with no murmurs and normal pedal pulses bilaterally.  Neurologic Neurologic evaluation reveals -alert and oriented x 3 with no impairment of recent or remote memory. Mental Status-Normal.  Musculoskeletal Normal Exam - Left-Upper Extremity Strength Normal and Lower Extremity Strength Normal. Normal Exam - Right-Upper Extremity Strength Normal and Lower Extremity Strength Normal.  Lymphatic Head & Neck  General Head & Neck Lymphatics: Bilateral - Description - Normal. Axillary  General Axillary Region: Bilateral - Description - Normal. Tenderness - Non Tender.    Assessment & Plan (Malary Aylesworth A. Dawn Kiper MD;  12:27 PM)  ATYPICAL DUCTAL HYPERPLASIA OF LEFT BREAST (N60.92) Impression: Recommend lumpectomy given small risk of malignancy.  May need to see high risk clinic given her atypical lobular hyperplasia finding.  May benefit from genetic screening. Risk of lumpectomy include bleeding, infection, seroma, more surgery, use of seed/wire, wound care, cosmetic deformity and the need for other treatments, death , blood clots, death. Pt agrees to proceed.  Current Plans We discussed the staging and pathophysiology of breast cancer. We discussed all of the different options for treatment for breast cancer including surgery, chemotherapy, radiation therapy, Herceptin, and antiestrogen therapy. We discussed a sentinel lymph node biopsy as she does not appear to having lymph node involvement right now. We discussed the performance of that with injection of radioactive tracer and blue dye. We discussed that she would have an incision underneath her axillary hairline. We discussed that there is a bout a 10-20% chance of having a positive node  with a sentinel lymph node biopsy and we will await the permanent pathology to make any other first further decisions in terms of her treatment. One of these options might be to return to the operating room to perform an axillary lymph node dissection. We discussed about a 1-2% risk lifetime of chronic shoulder pain as well as lymphedema associated with a sentinel lymph node biopsy. We discussed the options for  treatment of the breast cancer which included lumpectomy versus a mastectomy. We discussed the performance of the lumpectomy with a wire placement. We discussed a 10-20% chance of a positive margin requiring reexcision in the operating room. We also discussed that she may need radiation therapy or antiestrogen therapy or both if she undergoes lumpectomy. We discussed the mastectomy and the postoperative care for that as well. We discussed that there is no difference in her survival whether she undergoes lumpectomy with radiation therapy or antiestrogen therapy versus a mastectomy. There is a slight difference in the local recurrence rate being 3-5% with lumpectomy and about 1% with a mastectomy. We discussed the risks of operation including bleeding, infection, possible reoperation. She understands her further therapy will be based on what her stages at the time of her operation.  Pt Education - [facility] breast cancer surgery: discussed with patient and provided information. 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

## 2016-08-22 NOTE — Discharge Instructions (Signed)
° °Post Anesthesia Home Care Instructions ° °Activity: °Get plenty of rest for the remainder of the day. A responsible adult should stay with you for 24 hours following the procedure.  °For the next 24 hours, DO NOT: °-Drive a car °-Operate machinery °-Drink alcoholic beverages °-Take any medication unless instructed by your physician °-Make any legal decisions or sign important papers. ° °Meals: °Start with liquid foods such as gelatin or soup. Progress to regular foods as tolerated. Avoid greasy, spicy, heavy foods. If nausea and/or vomiting occur, drink only clear liquids until the nausea and/or vomiting subsides. Call your physician if vomiting continues. ° °Special Instructions/Symptoms: °Your throat may feel dry or sore from the anesthesia or the breathing tube placed in your throat during surgery. If this causes discomfort, gargle with warm salt water. The discomfort should disappear within 24 hours. ° °If you had a scopolamine patch placed behind your ear for the management of post- operative nausea and/or vomiting: ° °1. The medication in the patch is effective for 72 hours, after which it should be removed.  Wrap patch in a tissue and discard in the trash. Wash hands thoroughly with soap and water. °2. You may remove the patch earlier than 72 hours if you experience unpleasant side effects which may include dry mouth, dizziness or visual disturbances. °3. Avoid touching the patch. Wash your hands with soap and water after contact with the patch. °  ° ° °[facility] °Office Phone Number [phone] ° °BREAST BIOPSY/ PARTIAL MASTECTOMY: POST OP INSTRUCTIONS ° °Always review your discharge instruction sheet given to you by the facility where your surgery was performed. ° °IF YOU HAVE DISABILITY OR FAMILY LEAVE FORMS, YOU MUST BRING THEM TO THE OFFICE FOR PROCESSING.  DO NOT GIVE THEM TO YOUR DOCTOR. ° °1. A prescription for pain medication may be given to you upon discharge.  Take your pain  medication as prescribed, if needed.  If narcotic pain medicine is not needed, then you may take acetaminophen (Tylenol) or ibuprofen (Advil) as needed. °2. Take your usually prescribed medications unless otherwise directed °3. If you need a refill on your pain medication, please contact your pharmacy.  They will contact our office to request authorization.  Prescriptions will not be filled after 5pm or on week-ends. °4. You should eat very light the first 24 hours after surgery, such as soup, crackers, pudding, etc.  Resume your normal diet the day after surgery. °5. Most patients will experience some swelling and bruising in the breast.  Ice packs and a good support bra will help.  Swelling and bruising can take several days to resolve.  °6. It is common to experience some constipation if taking pain medication after surgery.  Increasing fluid intake and taking a stool softener will usually help or prevent this problem from occurring.  A mild laxative (Milk of Magnesia or Miralax) should be taken according to package directions if there are no bowel movements after 48 hours. °7. Unless discharge instructions indicate otherwise, you may remove your bandages 24-48 hours after surgery, and you may shower at that time.  You may have steri-strips (small skin tapes) in place directly over the incision.  These strips should be left on the skin for 7-10 days.  If your surgeon used skin glue on the incision, you may shower in 24 hours.  The glue will flake off over the next 2-3 weeks.  Any sutures or staples will be removed at the office during your follow-up visit. °8.   ACTIVITIES:  You may resume regular daily activities (gradually increasing) beginning the next day.  Wearing a good support bra or sports bra minimizes pain and swelling.  You may have sexual intercourse when it is comfortable. °a. You may drive when you no longer are taking prescription pain medication, you can comfortably wear a seatbelt, and you can  safely maneuver your car and apply brakes. °b. RETURN TO WORK:  ______________________________________________________________________________________ °9. You should see your doctor in the office for a follow-up appointment approximately two weeks after your surgery.  Your doctor’s nurse will typically make your follow-up appointment when she calls you with your pathology report.  Expect your pathology report 2-3 business days after your surgery.  You may call to check if you do not hear from us after three days. °10. OTHER INSTRUCTIONS: _______________________________________________________________________________________________ _____________________________________________________________________________________________________________________________________ °_____________________________________________________________________________________________________________________________________ °_____________________________________________________________________________________________________________________________________ ° °WHEN TO CALL YOUR DOCTOR: °1. Fever over 101.0 °2. Nausea and/or vomiting. °3. Extreme swelling or bruising. °4. Continued bleeding from incision. °5. Increased pain, redness, or drainage from the incision. ° °The clinic staff is available to answer your questions during regular business hours.  Please don’t hesitate to call and ask to speak to one of the nurses for clinical concerns.  If you have a medical emergency, go to the nearest emergency room or call 911.  A surgeon from [facility] is always on call at the hospital. ° °For further questions, please visit [email or web address]  °

## 2016-08-22 NOTE — Transfer of Care (Signed)
Immediate Anesthesia Transfer of Care Note  Patient: Storey Hopf Quesenberry  Procedure(s) Performed: Procedure(s): LEFT BREAST LUMPECTOMY WITH NEEDLE LOCALIZATION X2 (Left)  Patient Location: PACU  Anesthesia Type:General  Level of Consciousness: awake, alert  and oriented  Airway & Oxygen Therapy: Patient Spontanous Breathing and Patient connected to face mask oxygen  Post-op Assessment: Report given to RN and Post -op Vital signs reviewed and stable  Post vital signs: Reviewed and stable  Last Vitals:  Vitals:   08/22/16 1028  BP: 129/74  Pulse: (!) 59  Resp: 18  Temp: 36.8 C    Last Pain:  Vitals:   08/22/16 1028  TempSrc: Oral      Patients Stated Pain Goal: 0 ([id number])  Complications: No apparent anesthesia complications

## 2016-08-22 NOTE — Anesthesia Preprocedure Evaluation (Signed)
Anesthesia Evaluation  Patient identified by MRN, date of birth, ID band Patient awake    Reviewed: Allergy & Precautions, NPO status , Patient's Chart, lab work & pertinent test results  Airway Mallampati: I  TM Distance: >3 FB Neck ROM: Full    Dental   Pulmonary    Pulmonary exam normal        Cardiovascular Normal cardiovascular exam     Neuro/Psych Depression    GI/Hepatic   Endo/Other    Renal/GU      Musculoskeletal   Abdominal   Peds  Hematology   Anesthesia Other Findings   Reproductive/Obstetrics                             Anesthesia Physical Anesthesia Plan  ASA: II  Anesthesia Plan: General   Post-op Pain Management:    Induction: Intravenous  Airway Management Planned: LMA  Additional Equipment:   Intra-op Plan:   Post-operative Plan: Extubation in OR  Informed Consent: I have reviewed the patients History and Physical, chart, labs and discussed the procedure including the risks, benefits and alternatives for the proposed anesthesia with the patient or authorized representative who has indicated his/her understanding and acceptance.     Plan Discussed with: CRNA and Surgeon  Anesthesia Plan Comments:         Anesthesia Quick Evaluation

## 2016-08-22 NOTE — Op Note (Signed)
[name]  1971-03-01  [id number]  08/22/2016   Preoperative diagnosis: left breast papilloma times two   Postoperative diagnosis: same  Procedure: Left breast wire localized lumpectomy times two  Surgeon: Turner Daniels, MD, FACS  Anesthesia: General and local   Clinical History and Indications: this patient presents for a guidewire localized excision of a two papilloma of the left breast.    Description of procedure: The patient was seen in the holding area and the plans for the procedure reviewed. The left  breast was marked as the operative side. The wire localizing films were reviewed. The procedure has been discussed with the patient. Alternatives to surgery have been discussed with the patient.  Risks of surgery include bleeding,  Infection,  Seroma formation, death,  and the need for further surgery.   The patient understands and wishes to proceed.  The patient was taken to the operating room and after satisfactory general anesthesia had been obtained the left breast was prepped and draped and the timeout was performed.  The incision was made over the presumed area of the papilloma left lateral breast. Skin flaps were raised and using cautery the area were completely excised. Bleeders were controlled with either cautery or sutures as needed.  After achieving hemostasis, the incision was closed with 3-0 Vicryl, 4-0 Monocryl subcuticular, and Dermabond.  The patient tolerated the procedure well. There were no operative complications. All counts were correct.   EBL: minimal  Turner Daniels, MD, FACS 08/22/2016 1:03 PM

## 2016-08-23 ENCOUNTER — Encounter (HOSPITAL_BASED_OUTPATIENT_CLINIC_OR_DEPARTMENT_OTHER): Payer: Self-pay | Admitting: Surgery

## 2016-12-14 DIAGNOSIS — Z0000 Encounter for general adult medical examination without abnormal findings: Secondary | ICD-10-CM | POA: Diagnosis not present

## 2017-06-18 DIAGNOSIS — R51 Headache: Secondary | ICD-10-CM | POA: Diagnosis not present

## 2017-12-18 DIAGNOSIS — R51 Headache: Secondary | ICD-10-CM | POA: Diagnosis not present

## 2017-12-18 DIAGNOSIS — Z13220 Encounter for screening for lipoid disorders: Secondary | ICD-10-CM | POA: Diagnosis not present

## 2017-12-18 DIAGNOSIS — Z0000 Encounter for general adult medical examination without abnormal findings: Secondary | ICD-10-CM | POA: Diagnosis not present

## 2019-07-31 ENCOUNTER — Other Ambulatory Visit: Payer: Self-pay | Admitting: Physician Assistant

## 2019-07-31 DIAGNOSIS — Z1231 Encounter for screening mammogram for malignant neoplasm of breast: Secondary | ICD-10-CM

## 2019-08-01 ENCOUNTER — Other Ambulatory Visit: Payer: Self-pay

## 2019-08-01 ENCOUNTER — Ambulatory Visit
Admission: RE | Admit: 2019-08-01 | Discharge: 2019-08-01 | Disposition: A | Discharge status: Home / Self Care | Payer: BC Managed Care – PPO | Source: Ambulatory Visit | Attending: Physician Assistant | Admitting: Physician Assistant

## 2019-08-01 DIAGNOSIS — Z1231 Encounter for screening mammogram for malignant neoplasm of breast: Secondary | ICD-10-CM

## 2019-08-05 ENCOUNTER — Other Ambulatory Visit: Payer: Self-pay | Admitting: Physician Assistant

## 2019-08-05 DIAGNOSIS — R928 Other abnormal and inconclusive findings on diagnostic imaging of breast: Secondary | ICD-10-CM

## 2019-08-11 ENCOUNTER — Ambulatory Visit
Admission: RE | Admit: 2019-08-11 | Discharge: 2019-08-11 | Disposition: A | Discharge status: Home / Self Care | Payer: BC Managed Care – PPO | Source: Ambulatory Visit | Attending: Physician Assistant | Admitting: Physician Assistant

## 2019-08-11 DIAGNOSIS — R928 Other abnormal and inconclusive findings on diagnostic imaging of breast: Secondary | ICD-10-CM

## 2019-08-11 DIAGNOSIS — N6011 Diffuse cystic mastopathy of right breast: Secondary | ICD-10-CM | POA: Diagnosis not present

## 2019-09-24 DIAGNOSIS — N390 Urinary tract infection, site not specified: Secondary | ICD-10-CM | POA: Diagnosis not present

## 2019-09-24 DIAGNOSIS — R319 Hematuria, unspecified: Secondary | ICD-10-CM | POA: Diagnosis not present

## 2020-03-01 DIAGNOSIS — F330 Major depressive disorder, recurrent, mild: Secondary | ICD-10-CM | POA: Diagnosis not present

## 2020-05-31 DIAGNOSIS — F330 Major depressive disorder, recurrent, mild: Secondary | ICD-10-CM | POA: Diagnosis not present

## 2020-06-08 DIAGNOSIS — F330 Major depressive disorder, recurrent, mild: Secondary | ICD-10-CM | POA: Diagnosis not present

## 2020-07-26 DIAGNOSIS — F330 Major depressive disorder, recurrent, mild: Secondary | ICD-10-CM | POA: Diagnosis not present

## 2020-11-17 DIAGNOSIS — F330 Major depressive disorder, recurrent, mild: Secondary | ICD-10-CM | POA: Diagnosis not present

## 2021-03-07 ENCOUNTER — Other Ambulatory Visit: Payer: Self-pay | Admitting: Physician Assistant

## 2021-03-07 DIAGNOSIS — Z1231 Encounter for screening mammogram for malignant neoplasm of breast: Secondary | ICD-10-CM

## 2021-03-09 ENCOUNTER — Ambulatory Visit
Admission: RE | Admit: 2021-03-09 | Discharge: 2021-03-09 | Disposition: A | Discharge status: Home / Self Care | Payer: BC Managed Care – PPO | Source: Ambulatory Visit

## 2021-03-09 ENCOUNTER — Other Ambulatory Visit: Payer: Self-pay

## 2021-03-09 DIAGNOSIS — Z1231 Encounter for screening mammogram for malignant neoplasm of breast: Secondary | ICD-10-CM | POA: Diagnosis not present

## 2021-03-10 ENCOUNTER — Other Ambulatory Visit: Payer: Self-pay | Admitting: Physician Assistant

## 2021-03-10 DIAGNOSIS — R928 Other abnormal and inconclusive findings on diagnostic imaging of breast: Secondary | ICD-10-CM

## 2021-03-30 ENCOUNTER — Ambulatory Visit
Admission: RE | Admit: 2021-03-30 | Discharge: 2021-03-30 | Disposition: A | Discharge status: Home / Self Care | Payer: BC Managed Care – PPO | Source: Ambulatory Visit | Attending: Physician Assistant | Admitting: Physician Assistant

## 2021-03-30 ENCOUNTER — Other Ambulatory Visit: Payer: Self-pay

## 2021-03-30 DIAGNOSIS — R928 Other abnormal and inconclusive findings on diagnostic imaging of breast: Secondary | ICD-10-CM

## 2021-03-30 DIAGNOSIS — R922 Inconclusive mammogram: Secondary | ICD-10-CM | POA: Diagnosis not present

## 2021-04-05 DIAGNOSIS — F339 Major depressive disorder, recurrent, unspecified: Secondary | ICD-10-CM | POA: Diagnosis not present

## 2021-04-05 DIAGNOSIS — Z124 Encounter for screening for malignant neoplasm of cervix: Secondary | ICD-10-CM | POA: Diagnosis not present

## 2021-04-05 DIAGNOSIS — Z23 Encounter for immunization: Secondary | ICD-10-CM | POA: Diagnosis not present

## 2021-04-05 DIAGNOSIS — Z13220 Encounter for screening for lipoid disorders: Secondary | ICD-10-CM | POA: Diagnosis not present

## 2021-04-05 DIAGNOSIS — Z0000 Encounter for general adult medical examination without abnormal findings: Secondary | ICD-10-CM | POA: Diagnosis not present

## 2021-04-05 DIAGNOSIS — R519 Headache, unspecified: Secondary | ICD-10-CM | POA: Diagnosis not present

## 2021-04-28 DIAGNOSIS — F330 Major depressive disorder, recurrent, mild: Secondary | ICD-10-CM | POA: Diagnosis not present

## 2021-05-06 DIAGNOSIS — D2239 Melanocytic nevi of other parts of face: Secondary | ICD-10-CM | POA: Diagnosis not present

## 2021-05-06 DIAGNOSIS — L821 Other seborrheic keratosis: Secondary | ICD-10-CM | POA: Diagnosis not present

## 2021-05-06 DIAGNOSIS — L820 Inflamed seborrheic keratosis: Secondary | ICD-10-CM | POA: Diagnosis not present

## 2021-05-25 DIAGNOSIS — F330 Major depressive disorder, recurrent, mild: Secondary | ICD-10-CM | POA: Diagnosis not present

## 2021-06-14 DIAGNOSIS — F339 Major depressive disorder, recurrent, unspecified: Secondary | ICD-10-CM | POA: Diagnosis not present

## 2021-08-25 DIAGNOSIS — F339 Major depressive disorder, recurrent, unspecified: Secondary | ICD-10-CM | POA: Diagnosis not present

## 2021-10-10 IMAGING — MG MM DIGITAL DIAGNOSTIC UNILAT*R* W/ TOMO W/ CAD
6 series · 6 of 18 positions shown · non-contrast
Comparison: Previous exam(s).

CLINICAL DATA: Recall from screening mammogram for a mass in the
right breast

EXAM:
DIGITAL DIAGNOSTIC RIGHT MAMMOGRAM WITH CAD AND TOMO
ULTRASOUND RIGHT BREAST

[R MLO synth-2D]
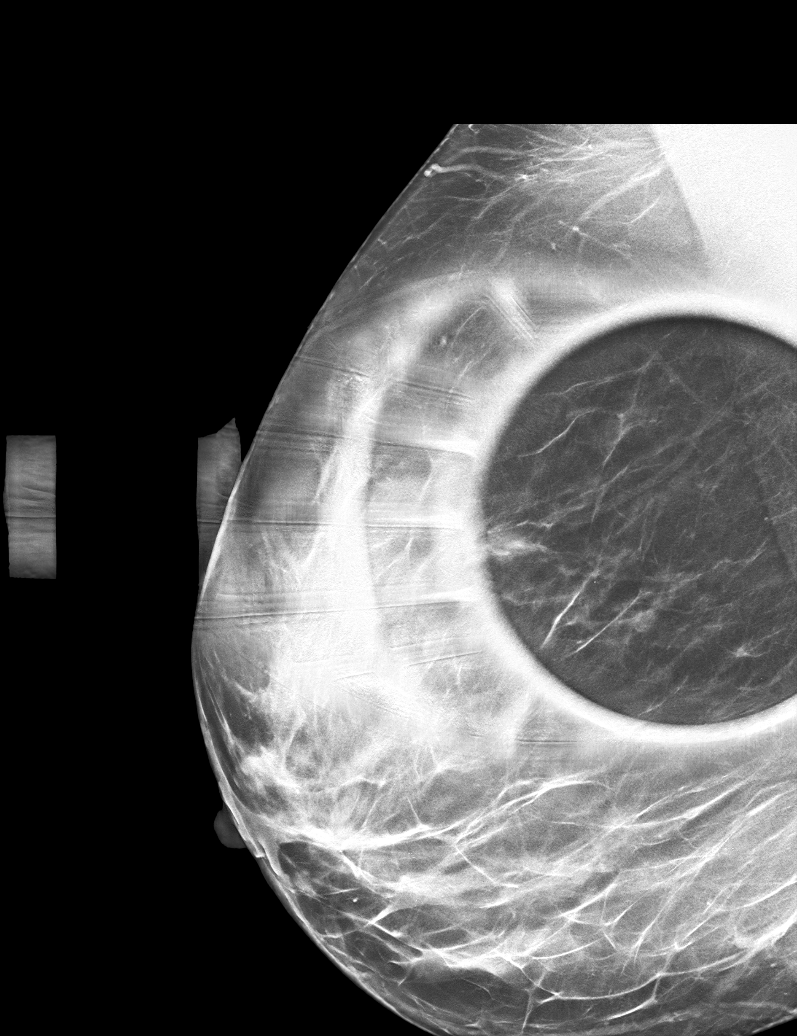

[R CC synth-2D]
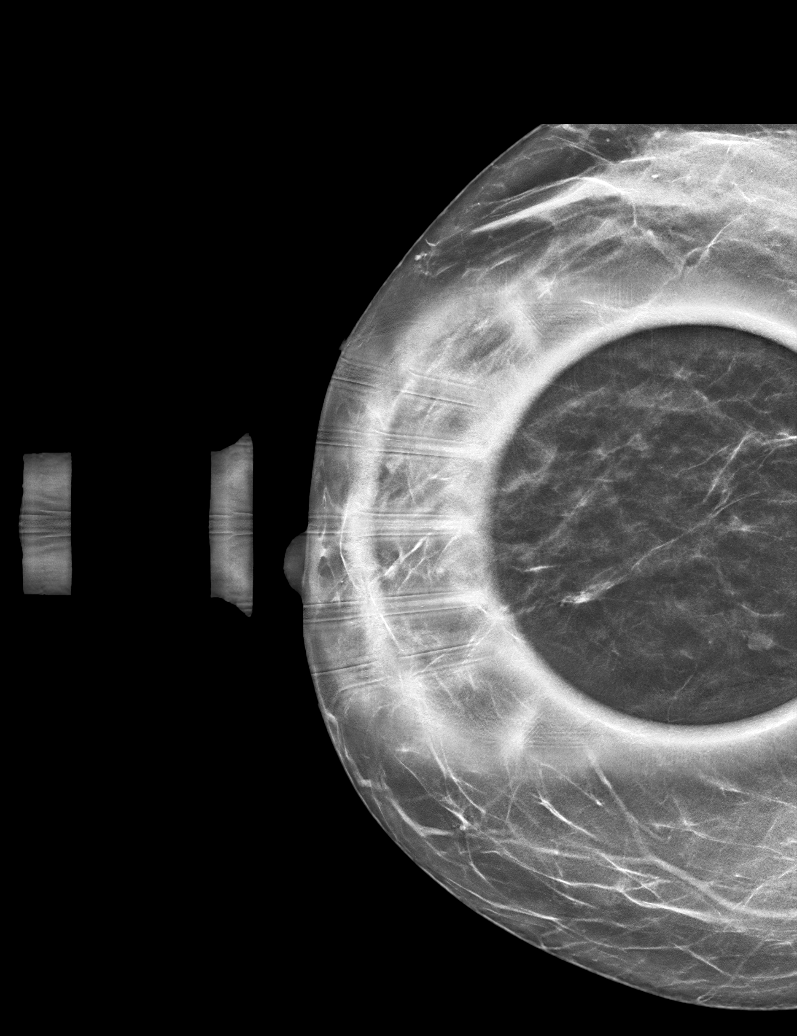

[R ML synth-2D]
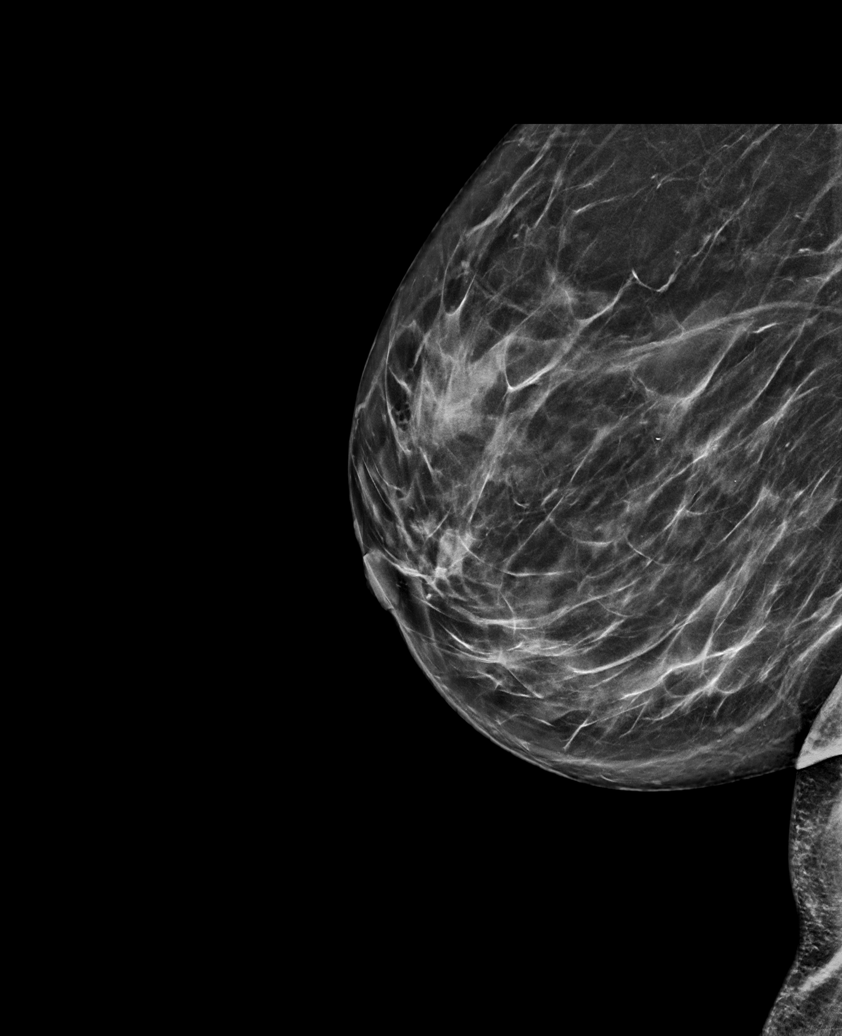

[R CC tomo · tomo slice 26/51.0]
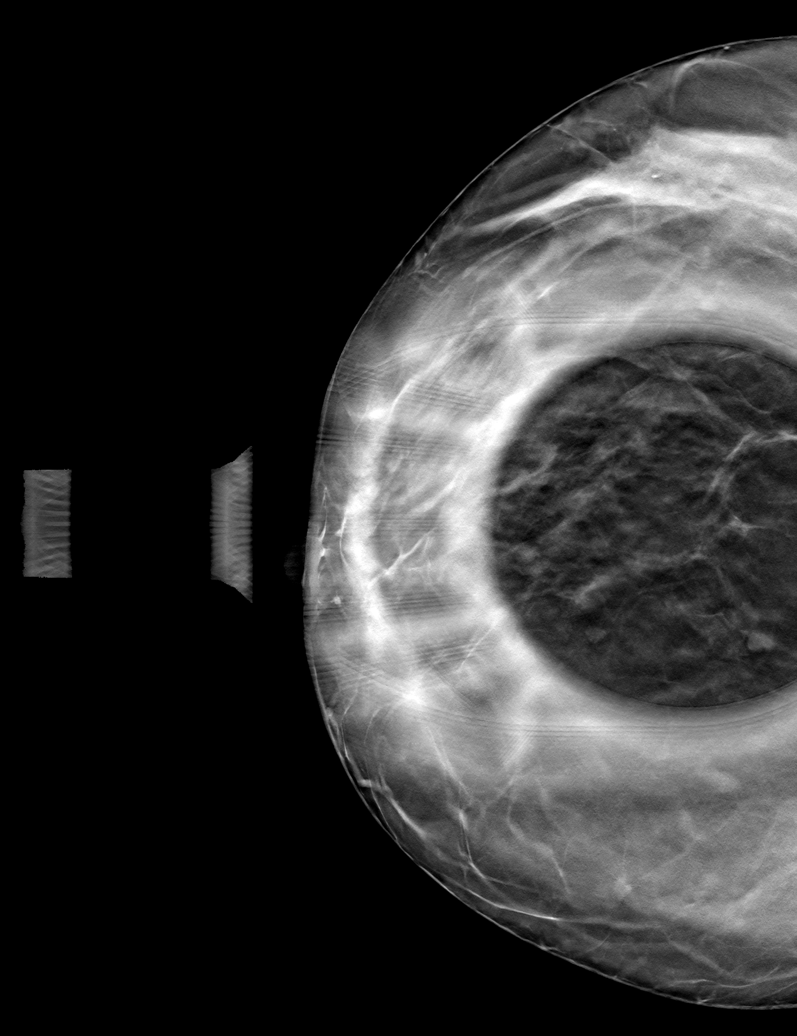

[R MLO tomo · tomo slice 31/61.0]
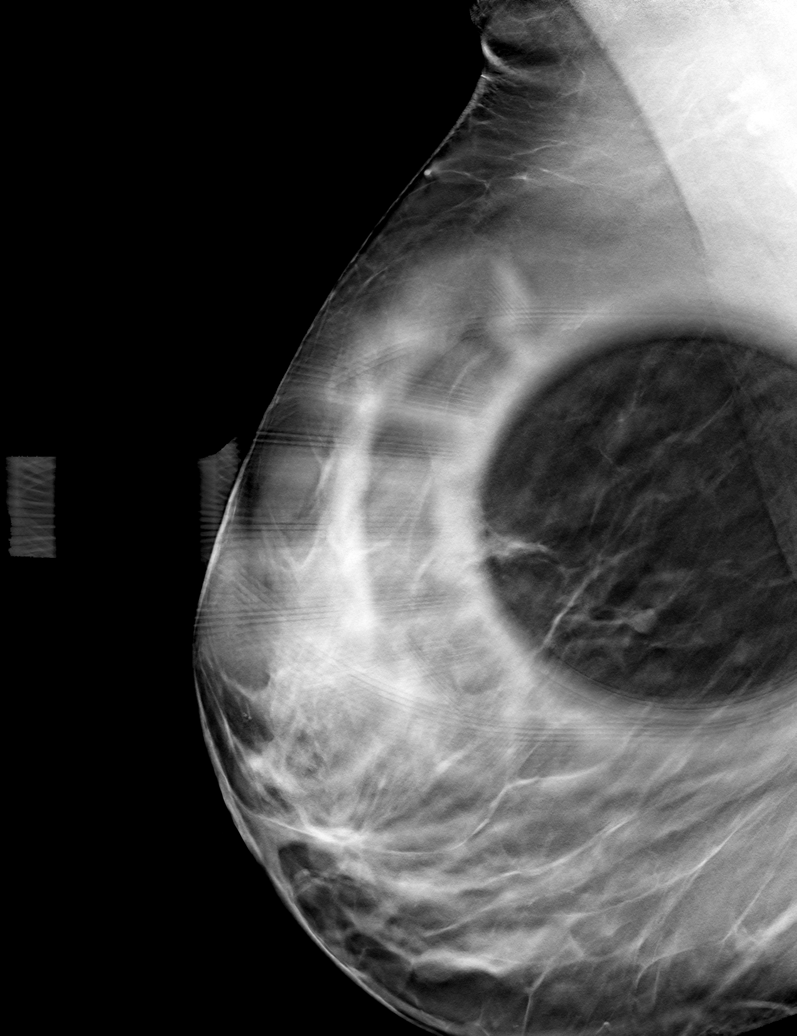

[R ML tomo · tomo slice 37/72.0]
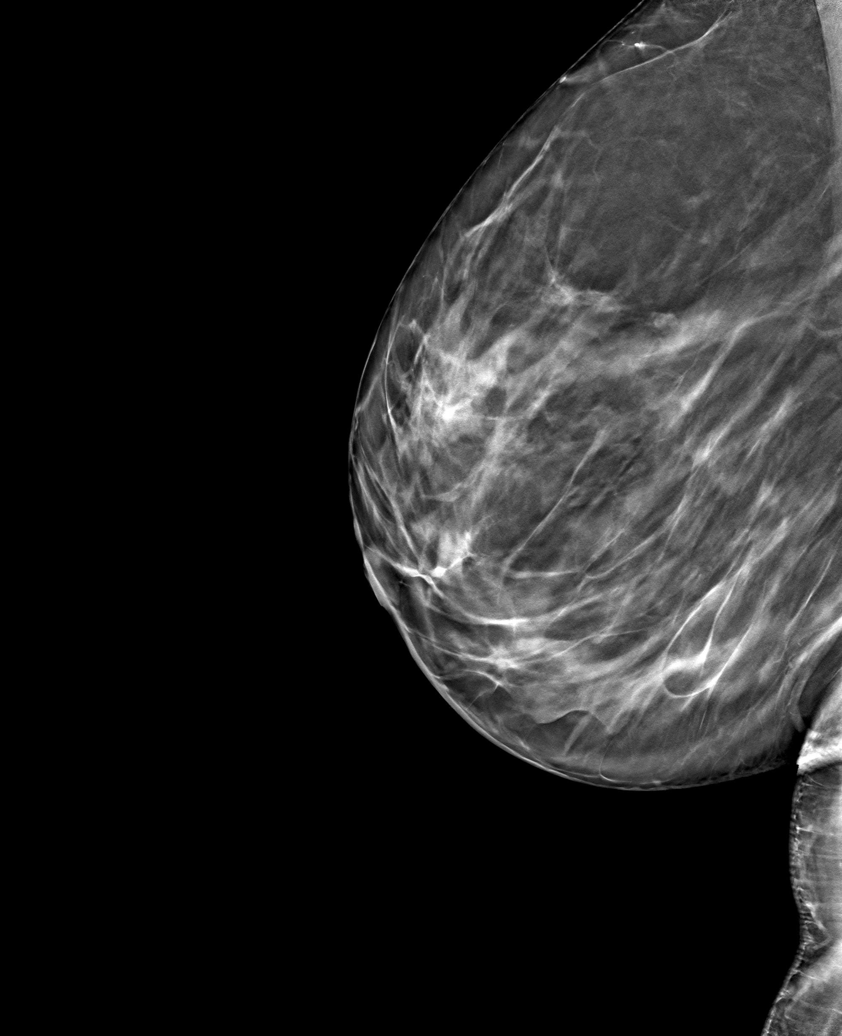

[6 of 18 positions shown; findings below may reference images not displayed]

ACR Breast Density Category c: The breast tissue is heterogeneously
dense, which may obscure small masses.
FINDINGS: Additional views including spot compression demonstrate an oval
circumscribed 5 mm mass in the upper central right breast at
posterior depth. Mammographic images were processed with CAD.

Targeted right breast ultrasound was performed. At 1 o'clock 7 cm
from the nipple a benign cluster of microcysts measures 6 x 3 x 6
mm. This corresponds to the mass seen on the mammogram. No
suspicious solid or cystic mass is identified.
IMPRESSION: Benign cluster of microcysts in the right breast. No evidence of
malignancy.

RECOMMENDATION:
Recommend routine annual screening mammogram in 1 year.

I have discussed the findings and recommendations with the patient.
If applicable, a reminder letter will be sent to the patient
regarding the next appointment.

BI-RADS CATEGORY  2: Benign.

## 2021-10-18 DIAGNOSIS — N390 Urinary tract infection, site not specified: Secondary | ICD-10-CM | POA: Diagnosis not present

## 2021-10-18 DIAGNOSIS — R319 Hematuria, unspecified: Secondary | ICD-10-CM | POA: Diagnosis not present

## 2022-02-14 DIAGNOSIS — Z1211 Encounter for screening for malignant neoplasm of colon: Secondary | ICD-10-CM | POA: Diagnosis not present

## 2022-02-14 DIAGNOSIS — K648 Other hemorrhoids: Secondary | ICD-10-CM | POA: Diagnosis not present

## 2022-02-14 DIAGNOSIS — D123 Benign neoplasm of transverse colon: Secondary | ICD-10-CM | POA: Diagnosis not present

## 2022-03-01 ENCOUNTER — Other Ambulatory Visit: Payer: Self-pay | Admitting: Physician Assistant

## 2022-03-01 DIAGNOSIS — Z1231 Encounter for screening mammogram for malignant neoplasm of breast: Secondary | ICD-10-CM

## 2022-03-10 ENCOUNTER — Ambulatory Visit
Admission: RE | Admit: 2022-03-10 | Discharge: 2022-03-10 | Disposition: A | Discharge status: Home / Self Care | Payer: BC Managed Care – PPO | Source: Ambulatory Visit | Attending: Physician Assistant | Admitting: Physician Assistant

## 2022-03-10 DIAGNOSIS — Z1231 Encounter for screening mammogram for malignant neoplasm of breast: Secondary | ICD-10-CM

## 2022-04-06 DIAGNOSIS — Z124 Encounter for screening for malignant neoplasm of cervix: Secondary | ICD-10-CM | POA: Diagnosis not present

## 2022-04-06 DIAGNOSIS — F339 Major depressive disorder, recurrent, unspecified: Secondary | ICD-10-CM | POA: Diagnosis not present

## 2022-04-06 DIAGNOSIS — R519 Headache, unspecified: Secondary | ICD-10-CM | POA: Diagnosis not present

## 2022-04-06 DIAGNOSIS — R635 Abnormal weight gain: Secondary | ICD-10-CM | POA: Diagnosis not present

## 2022-04-06 DIAGNOSIS — Z0000 Encounter for general adult medical examination without abnormal findings: Secondary | ICD-10-CM | POA: Diagnosis not present

## 2022-04-06 DIAGNOSIS — Z13220 Encounter for screening for lipoid disorders: Secondary | ICD-10-CM | POA: Diagnosis not present

## 2023-03-20 ENCOUNTER — Other Ambulatory Visit: Payer: Self-pay | Admitting: Physician Assistant

## 2023-03-20 DIAGNOSIS — Z1231 Encounter for screening mammogram for malignant neoplasm of breast: Secondary | ICD-10-CM

## 2023-03-26 ENCOUNTER — Ambulatory Visit
Admission: RE | Admit: 2023-03-26 | Discharge: 2023-03-26 | Disposition: A | Discharge status: Home / Self Care | Payer: BC Managed Care – PPO | Source: Ambulatory Visit | Attending: Physician Assistant | Admitting: Physician Assistant

## 2023-03-26 DIAGNOSIS — Z1231 Encounter for screening mammogram for malignant neoplasm of breast: Secondary | ICD-10-CM

## 2023-04-10 DIAGNOSIS — F339 Major depressive disorder, recurrent, unspecified: Secondary | ICD-10-CM | POA: Diagnosis not present

## 2023-04-10 DIAGNOSIS — E7800 Pure hypercholesterolemia, unspecified: Secondary | ICD-10-CM | POA: Diagnosis not present

## 2023-04-10 DIAGNOSIS — Z0000 Encounter for general adult medical examination without abnormal findings: Secondary | ICD-10-CM | POA: Diagnosis not present

## 2023-04-10 DIAGNOSIS — R519 Headache, unspecified: Secondary | ICD-10-CM | POA: Diagnosis not present

## 2023-04-10 DIAGNOSIS — Z131 Encounter for screening for diabetes mellitus: Secondary | ICD-10-CM | POA: Diagnosis not present

## 2023-07-09 DIAGNOSIS — E7800 Pure hypercholesterolemia, unspecified: Secondary | ICD-10-CM | POA: Diagnosis not present

## 2024-03-18 ENCOUNTER — Other Ambulatory Visit: Payer: Self-pay | Admitting: Physician Assistant

## 2024-03-18 DIAGNOSIS — Z1231 Encounter for screening mammogram for malignant neoplasm of breast: Secondary | ICD-10-CM

## 2024-03-26 ENCOUNTER — Ambulatory Visit
Admission: RE | Admit: 2024-03-26 | Discharge: 2024-03-26 | Disposition: A | Discharge status: Home / Self Care | Source: Ambulatory Visit

## 2024-03-26 DIAGNOSIS — Z1231 Encounter for screening mammogram for malignant neoplasm of breast: Secondary | ICD-10-CM | POA: Diagnosis not present

## 2024-03-30 ENCOUNTER — Other Ambulatory Visit: Payer: Self-pay | Admitting: Medical Genetics

## 2024-04-01 ENCOUNTER — Other Ambulatory Visit (HOSPITAL_COMMUNITY)
Admission: RE | Admit: 2024-04-01 | Discharge: 2024-04-01 | Disposition: A | Discharge status: Home / Self Care | Payer: Self-pay | Source: Ambulatory Visit | Attending: Medical Genetics | Admitting: Medical Genetics

## 2024-04-04 ENCOUNTER — Encounter: Payer: Self-pay | Admitting: Advanced Practice Midwife

## 2024-04-14 LAB — GENECONNECT MOLECULAR SCREEN: Genetic Analysis Overall Interpretation: NEGATIVE

## 2024-04-17 DIAGNOSIS — Z0000 Encounter for general adult medical examination without abnormal findings: Secondary | ICD-10-CM | POA: Diagnosis not present

## 2024-04-17 DIAGNOSIS — Z131 Encounter for screening for diabetes mellitus: Secondary | ICD-10-CM | POA: Diagnosis not present

## 2024-04-17 DIAGNOSIS — F339 Major depressive disorder, recurrent, unspecified: Secondary | ICD-10-CM | POA: Diagnosis not present

## 2024-04-17 DIAGNOSIS — R519 Headache, unspecified: Secondary | ICD-10-CM | POA: Diagnosis not present

## 2024-04-17 DIAGNOSIS — E7800 Pure hypercholesterolemia, unspecified: Secondary | ICD-10-CM | POA: Diagnosis not present

## 2024-06-11 DIAGNOSIS — E559 Vitamin D deficiency, unspecified: Secondary | ICD-10-CM | POA: Diagnosis not present

## 2024-06-11 DIAGNOSIS — Z6832 Body mass index (BMI) 32.0-32.9, adult: Secondary | ICD-10-CM | POA: Diagnosis not present

## 2024-06-11 DIAGNOSIS — Z9189 Other specified personal risk factors, not elsewhere classified: Secondary | ICD-10-CM | POA: Diagnosis not present

## 2024-06-11 DIAGNOSIS — R6889 Other general symptoms and signs: Secondary | ICD-10-CM | POA: Diagnosis not present

## 2024-06-11 DIAGNOSIS — E782 Mixed hyperlipidemia: Secondary | ICD-10-CM | POA: Diagnosis not present

## 2024-06-11 DIAGNOSIS — G43909 Migraine, unspecified, not intractable, without status migrainosus: Secondary | ICD-10-CM | POA: Diagnosis not present

## 2024-06-11 DIAGNOSIS — F339 Major depressive disorder, recurrent, unspecified: Secondary | ICD-10-CM | POA: Diagnosis not present

## 2024-06-11 DIAGNOSIS — R7303 Prediabetes: Secondary | ICD-10-CM | POA: Diagnosis not present

## 2024-06-11 DIAGNOSIS — R0683 Snoring: Secondary | ICD-10-CM | POA: Diagnosis not present

## 2024-06-12 DIAGNOSIS — M8589 Other specified disorders of bone density and structure, multiple sites: Secondary | ICD-10-CM | POA: Diagnosis not present

## 2024-06-12 DIAGNOSIS — Z13820 Encounter for screening for osteoporosis: Secondary | ICD-10-CM | POA: Diagnosis not present

## 2024-06-13 ENCOUNTER — Ambulatory Visit (INDEPENDENT_AMBULATORY_CARE_PROVIDER_SITE_OTHER): Admitting: Podiatry

## 2024-06-13 DIAGNOSIS — M722 Plantar fascial fibromatosis: Secondary | ICD-10-CM

## 2024-06-13 NOTE — Progress Notes (Signed)
  Subjective:  Patient ID: [name], female    DOB: [birth year],  MRN: [id number]  Chief Complaint  Patient presents with   Foot Pain    53 y.o. female presents with the above complaint.  Patient presents with right heel pain that has been off for quite some time is progressive gotten worse worse with ambulation worse with pressure patient would like to discuss treatment options for her.  She states she has been doing this for quite some time pain scale 7 out of 10 dull aching nature right side is worse than left side.   Review of Systems: Negative except as noted in the HPI. Denies N/V/F/Ch.  Past Medical History:  Diagnosis Date   Breast mass, left    x2   Depression    Headache     Current Outpatient Medications:    DULoxetine (CYMBALTA) 60 MG capsule, Take 60 mg by mouth daily., Disp: , Rfl:    HYDROcodone -acetaminophen  (NORCO) 5-325 MG tablet, Take 1 tablet by mouth every 6 (six) hours as needed for moderate pain., Disp: 15 tablet, Rfl: 0   levonorgestrel (MIRENA, 52 MG,) 20 MCG/24HR IUD, 1 each by Intrauterine route once., Disp: , Rfl:   Social History   Tobacco Use  Smoking Status Never  Smokeless Tobacco Never    Allergies  Allergen Reactions   Sulfa Antibiotics Hives   Objective:  There were no vitals filed for this visit. There is no height or weight on file to calculate BMI. Constitutional Well developed. Well nourished.  Vascular Dorsalis pedis pulses palpable bilaterally. Posterior tibial pulses palpable bilaterally. Capillary refill normal to all digits.  No cyanosis or clubbing noted. Pedal hair growth normal.  Neurologic Normal speech. Oriented to person, place, and time. Epicritic sensation to light touch grossly present bilaterally.  Dermatologic Nails well groomed and normal in appearance. No open wounds. No skin lesions.  Orthopedic: Normal joint ROM without pain or crepitus bilaterally. No visible deformities. Tender to palpation at  the calcaneal tuber right. No pain with calcaneal squeeze right. Ankle ROM diminished range of motion right. Silfverskiold Test: positive right.   Radiographs: None  Assessment:   1. Plantar fasciitis of right foot    Plan:  Patient was evaluated and treated and all questions answered.  Plantar Fasciitis, right - XR reviewed as above.  - Educated on icing and stretching. Instructions given.  - Injection delivered to the plantar fascia as below. - DME: Plantar fascial brace dispensed to support the medial longitudinal arch of the foot and offload pressure from the heel and prevent arch collapse during weightbearing - Pharmacologic management: None  Procedure: Injection Tendon/Ligament Location: Right plantar fascia at the glabrous junction; medial approach. Skin Prep: alcohol Injectate: 0.5 cc 0.5% marcaine  plain, 0.5 cc of 1% Lidocaine , 0.5 cc kenalog 10. Disposition: Patient tolerated procedure well. Injection site dressed with a band-aid.  No follow-ups on file.

## 2024-06-25 DIAGNOSIS — R7303 Prediabetes: Secondary | ICD-10-CM | POA: Diagnosis not present

## 2024-06-25 DIAGNOSIS — E782 Mixed hyperlipidemia: Secondary | ICD-10-CM | POA: Diagnosis not present

## 2024-06-25 DIAGNOSIS — F339 Major depressive disorder, recurrent, unspecified: Secondary | ICD-10-CM | POA: Diagnosis not present

## 2024-06-25 DIAGNOSIS — G43909 Migraine, unspecified, not intractable, without status migrainosus: Secondary | ICD-10-CM | POA: Diagnosis not present

## 2024-07-11 ENCOUNTER — Ambulatory Visit: Admitting: Podiatry

## 2024-07-14 DIAGNOSIS — E559 Vitamin D deficiency, unspecified: Secondary | ICD-10-CM | POA: Diagnosis not present

## 2024-07-14 DIAGNOSIS — R7303 Prediabetes: Secondary | ICD-10-CM | POA: Diagnosis not present

## 2024-07-14 DIAGNOSIS — G43909 Migraine, unspecified, not intractable, without status migrainosus: Secondary | ICD-10-CM | POA: Diagnosis not present

## 2024-07-14 DIAGNOSIS — E782 Mixed hyperlipidemia: Secondary | ICD-10-CM | POA: Diagnosis not present

## 2024-07-16 ENCOUNTER — Ambulatory Visit: Admitting: Podiatry

## 2024-07-16 DIAGNOSIS — Q666 Other congenital valgus deformities of feet: Secondary | ICD-10-CM | POA: Diagnosis not present

## 2024-07-16 DIAGNOSIS — M722 Plantar fascial fibromatosis: Secondary | ICD-10-CM | POA: Diagnosis not present

## 2024-07-16 NOTE — Progress Notes (Signed)
  Subjective:  Patient ID: [name], female    DOB: 07-10-1971,  MRN: [id number]  Chief Complaint  Patient presents with   Plantar Fasciitis    53 y.o. female presents with the above complaint.  Patient presents with right plantar fasciitis she states that she is doing better.  She still has some residual pain she would like to discuss next treatment plan.  She would also like to discuss orthotics options.  Denies any other acute complaints.   Review of Systems: Negative except as noted in the HPI. Denies N/V/F/Ch.  Past Medical History:  Diagnosis Date   Breast mass, left    x2   Depression    Headache     Current Outpatient Medications:    DULoxetine (CYMBALTA) 60 MG capsule, Take 60 mg by mouth daily., Disp: , Rfl:    HYDROcodone -acetaminophen  (NORCO) 5-325 MG tablet, Take 1 tablet by mouth every 6 (six) hours as needed for moderate pain., Disp: 15 tablet, Rfl: 0   levonorgestrel (MIRENA, 52 MG,) 20 MCG/24HR IUD, 1 each by Intrauterine route once., Disp: , Rfl:   Social History   Tobacco Use  Smoking Status Never  Smokeless Tobacco Never    Allergies  Allergen Reactions   Sulfa Antibiotics Hives   Objective:  There were no vitals filed for this visit. There is no height or weight on file to calculate BMI. Constitutional Well developed. Well nourished.  Vascular Dorsalis pedis pulses palpable bilaterally. Posterior tibial pulses palpable bilaterally. Capillary refill normal to all digits.  No cyanosis or clubbing noted. Pedal hair growth normal.  Neurologic Normal speech. Oriented to person, place, and time. Epicritic sensation to light touch grossly present bilaterally.  Dermatologic Nails well groomed and normal in appearance. No open wounds. No skin lesions.  Orthopedic: Normal joint ROM without pain or crepitus bilaterally. No visible deformities. Tender to palpation at the calcaneal tuber right. No pain with calcaneal squeeze right. Ankle ROM  diminished range of motion right. Silfverskiold Test: positive right.   Radiographs: None  Assessment:   1. Plantar fasciitis of right foot   2. Pes planovalgus     Plan:  Patient was evaluated and treated and all questions answered.  Plantar Fasciitis, right - XR reviewed as above.  - Educated on icing and stretching. Instructions given.  - Second injection delivered to the plantar fascia as below. - DME: Plantar fascial brace dispensed to support the medial longitudinal arch of the foot and offload pressure from the heel and prevent arch collapse during weightbearing - Pharmacologic management: None  Pes planovalgus/foot deformity -I explained to patient the etiology of pes planovalgus and relationship with heel pain/arch pain and various treatment options were discussed.  Given patient foot structure in the setting of heel pain/arch pain I believe patient will benefit from custom-made orthotics to help control the hindfoot motion support the arch of the foot and take the stress away from arches.  Patient agrees with the plan like to proceed with orthotics -Patient was casted for orthotics   Procedure: Injection Tendon/Ligament Location: Right plantar fascia at the glabrous junction; medial approach. Skin Prep: alcohol Injectate: 0.5 cc 0.5% marcaine  plain, 0.5 cc of 1% Lidocaine , 0.5 cc kenalog 10. Disposition: Patient tolerated procedure well. Injection site dressed with a band-aid.  No follow-ups on file.

## 2024-08-18 ENCOUNTER — Telehealth: Payer: Self-pay

## 2024-08-18 NOTE — Telephone Encounter (Signed)
 Orthotics are in as of 08/18/2024

## 2024-08-19 DIAGNOSIS — R7303 Prediabetes: Secondary | ICD-10-CM | POA: Diagnosis not present

## 2024-08-19 DIAGNOSIS — F339 Major depressive disorder, recurrent, unspecified: Secondary | ICD-10-CM | POA: Diagnosis not present

## 2024-08-19 DIAGNOSIS — E782 Mixed hyperlipidemia: Secondary | ICD-10-CM | POA: Diagnosis not present

## 2024-08-19 DIAGNOSIS — G43909 Migraine, unspecified, not intractable, without status migrainosus: Secondary | ICD-10-CM | POA: Diagnosis not present

## 2024-09-08 ENCOUNTER — Ambulatory Visit

## 2024-09-08 DIAGNOSIS — M722 Plantar fascial fibromatosis: Secondary | ICD-10-CM

## 2024-09-08 DIAGNOSIS — Q666 Other congenital valgus deformities of feet: Secondary | ICD-10-CM

## 2024-09-08 NOTE — Progress Notes (Signed)
